# Patient Record
Sex: Female | Born: 1950
Health system: Southern US, Community
[De-identification: ages and names within clinical notes are randomized; demographics above are authoritative.]

## PROBLEM LIST (undated history)

## (undated) HISTORY — PX: APPENDECTOMY: SHX54

## (undated) HISTORY — PX: TUBAL LIGATION: SHX77

## (undated) HISTORY — PX: BUNIONECTOMY: SHX129

## (undated) HISTORY — PX: COSMETIC SURGERY: SHX468

---

## 2004-12-05 LAB — HM COLONOSCOPY: HM Colonoscopy: NORMAL

## 2004-12-08 ENCOUNTER — Ambulatory Visit: Payer: Self-pay | Admitting: Gastroenterology

## 2005-11-21 ENCOUNTER — Ambulatory Visit: Payer: Self-pay | Admitting: Unknown Physician Specialty

## 2006-09-25 ENCOUNTER — Ambulatory Visit: Payer: Self-pay | Admitting: Podiatry

## 2006-12-26 ENCOUNTER — Ambulatory Visit: Payer: Self-pay | Admitting: Unknown Physician Specialty

## 2007-12-30 ENCOUNTER — Ambulatory Visit: Payer: Self-pay | Admitting: Unknown Physician Specialty

## 2008-12-05 LAB — HM DEXA SCAN

## 2008-12-31 ENCOUNTER — Ambulatory Visit: Payer: Self-pay | Admitting: Unknown Physician Specialty

## 2009-07-14 ENCOUNTER — Emergency Department: Payer: Self-pay | Admitting: Emergency Medicine

## 2009-08-23 ENCOUNTER — Emergency Department: Payer: Self-pay | Admitting: Emergency Medicine

## 2009-09-09 HISTORY — PX: BREAST BIOPSY: SHX20

## 2010-01-03 ENCOUNTER — Ambulatory Visit: Payer: Self-pay | Admitting: Unknown Physician Specialty

## 2010-12-06 LAB — HM PAP SMEAR: HM Pap smear: NORMAL

## 2010-12-06 LAB — HM MAMMOGRAPHY: HM Mammogram: NORMAL

## 2011-01-23 ENCOUNTER — Ambulatory Visit: Payer: Self-pay | Admitting: Unknown Physician Specialty

## 2011-02-06 LAB — HM MAMMOGRAPHY

## 2011-05-08 ENCOUNTER — Ambulatory Visit: Payer: Self-pay | Admitting: Unknown Physician Specialty

## 2011-12-06 ENCOUNTER — Encounter: Payer: Self-pay | Admitting: Internal Medicine

## 2011-12-06 ENCOUNTER — Ambulatory Visit (INDEPENDENT_AMBULATORY_CARE_PROVIDER_SITE_OTHER): Payer: PRIVATE HEALTH INSURANCE | Admitting: Internal Medicine

## 2011-12-06 VITALS — BP 130/72 | HR 80 | Temp 98.5°F | Resp 14 | Ht 65.0 in | Wt 148.5 lb

## 2011-12-06 DIAGNOSIS — F3289 Other specified depressive episodes: Secondary | ICD-10-CM

## 2011-12-06 DIAGNOSIS — K219 Gastro-esophageal reflux disease without esophagitis: Secondary | ICD-10-CM

## 2011-12-06 DIAGNOSIS — N390 Urinary tract infection, site not specified: Secondary | ICD-10-CM

## 2011-12-06 DIAGNOSIS — F329 Major depressive disorder, single episode, unspecified: Secondary | ICD-10-CM

## 2011-12-06 DIAGNOSIS — Z1239 Encounter for other screening for malignant neoplasm of breast: Secondary | ICD-10-CM

## 2011-12-06 DIAGNOSIS — F32A Depression, unspecified: Secondary | ICD-10-CM

## 2011-12-06 MED ORDER — ZOLPIDEM TARTRATE 10 MG PO TABS: 10.0000 mg | ORAL_TABLET | Freq: Every evening | ORAL | Status: DC | PRN

## 2011-12-06 NOTE — Progress Notes (Signed)
Patient ID: Hetal Proano, female   DOB: May 21, 1951, 61 y.o.   MRN: [id number]  Patient Active Problem List  Diagnoses  . GERD (gastroesophageal reflux disease)  . Recurrent urinary tract infection  . Depressive disorder    Subjective:  CC:   Chief Complaint  Patient presents with  . New Patient    HPI:   Laniyah Rosenwald a 61 y.o. female who presents As a new patient for primary care. She is transferring from Dr. Francia Greaves. She has  a history of gastroesophageal reflux disease depression and a heart murmur as well as recurrent urinary tract infections .  her depression symptoms are mild but do include insomnia as well as daily anxiety related to her spouse Annette Stable is in caring for her aging parents are live 2 hours from her.  she has recently placed her father in memory care center and it does not seem to be adjusting well to being away from his wife . She receives no help from her brother.  She is concerned about the stress this will take of her marriage since he frequently has to go down on the weekends to deal with caregiver issues financial issues etc..  Her second concern is a history of osteopenia and need for followup. She has had a bone density test in the past but cannot remember for many years ago it has been. She is up-to-date on her Pap smears the last one in April 2012 and her last mammogram in May 2012   History reviewed. No pertinent past medical history.  Past Surgical History  Procedure Date  . Bunionectomy          The following portions of the patient's history were reviewed and updated as appropriate: Allergies, current medications, and problem list.    Review of Systems:   12 Pt  review of systems was negative except those addressed in the HPI,     History   Social History  . Marital Status: Married    Spouse Name: N/A    Number of Children: N/A  . Years of Education: N/A   Occupational History  . Not on file.   Social History Main Topics    . Smoking status: Never Smoker   . Smokeless tobacco: Never Used  . Alcohol Use: Yes  . Drug Use: No  . Sexually Active: Not on file   Other Topics Concern  . Not on file   Social History Narrative  . No narrative on file    Objective:  BP 130/72  Pulse 80  Temp(Src) 98.5 F (36.9 C) (Oral)  Resp 14  Ht 5\' 5"  (1.651 m)  Wt 148 lb 8 oz (67.359 kg)  BMI 24.71 kg/m2  SpO2 96%  General appearance: alert, cooperative and appears stated age Ears: normal TM's and external ear canals both ears Throat: lips, mucosa, and tongue normal; teeth and gums normal Neck: no adenopathy, no carotid bruit, supple, symmetrical, trachea midline and thyroid not enlarged, symmetric, no tenderness/mass/nodules Back: symmetric, no curvature. ROM normal. No CVA tenderness. Lungs: clear to auscultation bilaterally Heart: regular rate and rhythm, S1, S2 normal, no murmur, click, rub or gallop Abdomen: soft, non-tender; bowel sounds normal; no masses,  no organomegaly Pulses: 2+ and symmetric Skin: Skin color, texture, turgor normal. No rashes or lesions Lymph nodes: Cervical, supraclavicular, and axillary nodes normal.  Assessment and Plan:  Depressive disorder  her symptoms of her symptoms appear to be more anxiety driven and are aggravated by her spouse Annette Stable  is a primary caregiver to her aging parents who live to 2 hours aw.ay she does not want to start any SSRI therapy but wants to continue using Ambien for management at nighttime insomnia. I have recommended daily exercise as a natural way of dealing with anxiety and stress. She is looking for an exercise partner to work at her regular scheduled. Spent 20 minutes discussing the potential for burnout as a caregiver for patient for parents with dementia. I have recommended that she begin plans to move her parents closer to her before they become financially limited. We discussed several assisted living facilities in [place] that have memory care  facilities  Recurrent urinary tract infection She has not had any in recent months.  GERD (gastroesophageal reflux disease) Management daily use of Nexium. Prior trial of omeprazole did not manage symptoms adequately.    Updated Medication List Outpatient Encounter Prescriptions as of 12/06/2011  Medication Sig Dispense Refill  . calcium citrate-vitamin D (CITRACAL+D) 315-200 MG-UNIT per tablet Take 1 tablet by mouth 2 (two) times daily.      Marland Kitchen esomeprazole (NEXIUM) 40 MG capsule Take 40 mg by mouth daily before breakfast.      . Multiple Vitamin (MULTIVITAMIN) tablet Take 1 tablet by mouth daily.      Marland Kitchen zolpidem (AMBIEN) 10 MG tablet Take 1 tablet (10 mg total) by mouth at bedtime as needed.  30 tablet  5  . DISCONTD: zolpidem (AMBIEN) 10 MG tablet Take 10 mg by mouth at bedtime as needed.         Orders Placed This Encounter  Procedures  . HM MAMMOGRAPHY  . HM DEXA SCAN  . MM Digital Screening  . HM PAP SMEAR  . HM COLONOSCOPY    Return in about 1 month (around 01/05/2012).

## 2011-12-09 DIAGNOSIS — R300 Dysuria: Secondary | ICD-10-CM | POA: Insufficient documentation

## 2011-12-09 DIAGNOSIS — F329 Major depressive disorder, single episode, unspecified: Secondary | ICD-10-CM | POA: Insufficient documentation

## 2011-12-09 DIAGNOSIS — K219 Gastro-esophageal reflux disease without esophagitis: Secondary | ICD-10-CM | POA: Insufficient documentation

## 2011-12-09 NOTE — Assessment & Plan Note (Signed)
She has not had any in recent months.

## 2011-12-09 NOTE — Assessment & Plan Note (Signed)
her symptoms of her symptoms appear to be more anxiety driven and are aggravated by her spouse Annette Stable is a primary caregiver to her aging parents who live to 2 hours away she does not want to start any SSRI therapy but wants to continue using Ambien for management at nighttime insomnia. I have recommended daily exercise as a natural way of dealing with anxiety and stress. She is looking for an exercise partner to work at her regular scheduled.

## 2011-12-09 NOTE — Assessment & Plan Note (Signed)
Management daily use of Nexium. Prior trial of omeprazole did not manage symptoms adequately.

## 2012-02-13 ENCOUNTER — Encounter: Payer: PRIVATE HEALTH INSURANCE | Admitting: Internal Medicine

## 2012-03-26 ENCOUNTER — Encounter: Payer: Self-pay | Admitting: Internal Medicine

## 2012-04-07 ENCOUNTER — Encounter: Payer: Self-pay | Admitting: Internal Medicine

## 2012-04-07 ENCOUNTER — Ambulatory Visit (INDEPENDENT_AMBULATORY_CARE_PROVIDER_SITE_OTHER): Payer: PRIVATE HEALTH INSURANCE | Admitting: Internal Medicine

## 2012-04-07 VITALS — BP 116/70 | HR 76 | Temp 98.4°F | Resp 16 | Ht 65.0 in | Wt 146.2 lb

## 2012-04-07 DIAGNOSIS — Z0000 Encounter for general adult medical examination without abnormal findings: Secondary | ICD-10-CM

## 2012-04-07 DIAGNOSIS — Z1239 Encounter for other screening for malignant neoplasm of breast: Secondary | ICD-10-CM

## 2012-04-07 DIAGNOSIS — Z0001 Encounter for general adult medical examination with abnormal findings: Secondary | ICD-10-CM | POA: Insufficient documentation

## 2012-04-07 MED ORDER — ESOMEPRAZOLE MAGNESIUM 40 MG PO CPDR: 40.0000 mg | DELAYED_RELEASE_CAPSULE | Freq: Every day | ORAL | Status: DC

## 2012-04-07 NOTE — Assessment & Plan Note (Signed)
recommeding mammogram at [facility] due to microcalifications noted on 2012 exam.

## 2012-04-07 NOTE — Assessment & Plan Note (Signed)
Breast and GYN to be done next week by her gynecologist.

## 2012-04-07 NOTE — Progress Notes (Signed)
Patient ID: Kimberly Gill, female   DOB: 06-13-1951, 61 y.o.   MRN: [id number]   Subjective:    Kimberly Gill is a 61 y.o. female who presents for an annual exam. The patient has no complaints today. The patient is sexually active. GYN screening history: last pap: approximate date April 2012 and was normal. The patient wears seatbelts: yes. The patient participates in regular exercise: no. Has the patient ever been transfused or tattooed?: no. The patient reports that there is not domestic violence in her life.   Menstrual History: OB History    Grav Para Term Preterm Abortions TAB SAB Ect Mult Living                  Menarche age: 78 No LMP recorded. Patient is postmenopausal.    The following portions of the patient's history were reviewed and updated as appropriate: allergies, current medications, past family history, past medical history, past social history, past surgical history and problem list.  Review of Systems A comprehensive review of systems was negative.    Objective:    BP 116/70  Pulse 76  Temp 98.4 F (36.9 C) (Oral)  Resp 16  Ht 5\' 5"  (1.651 m)  Wt 146 lb 4 oz (66.339 kg)  BMI 24.34 kg/m2  SpO2 96%  General Appearance:    Alert, cooperative, no distress, appears stated age  Head:    Normocephalic, without obvious abnormality, atraumatic  Eyes:    PERRL, conjunctiva/corneas clear, EOM's intact, fundi    benign, both eyes  Ears:    Normal TM's and external ear canals, both ears  Nose:   Nares normal, septum midline, mucosa normal, no drainage    or sinus tenderness  Throat:   Lips, mucosa, and tongue normal; teeth and gums normal  Neck:   Supple, symmetrical, trachea midline, no adenopathy;    thyroid:  no enlargement/tenderness/nodules; no carotid   bruit or JVD  Back:     Symmetric, no curvature, ROM normal, no CVA tenderness  Lungs:     Clear to auscultation bilaterally, respirations unlabored  Chest Wall:    No tenderness or deformity   Heart:    Regular  rate and rhythm, S1 and S2 normal, no murmur, rub   or gallop  Abdomen:     Soft, non-tender, bowel sounds active all four quadrants,    no masses, no organomegaly  Extremities:   Extremities normal, atraumatic, no cyanosis or edema  Pulses:   2+ and symmetric all extremities  Skin:   Skin color, texture, turgor normal, no rashes or lesions  Lymph nodes:   Cervical, supraclavicular, and axillary nodes normal  Neurologic:   CNII-XII intact, normal strength, sensation and reflexes    throughout  .    Assessment:    Healthy female exam.    Plan:    Routine general medical examination at a health care facility Breast and GYN to be done next week by her gynecologist.  Screening for breast cancer recommeding mammogram at [facility] due to microcalifications noted on 2012 exam.    Updated Medication List Outpatient Encounter Prescriptions as of 04/07/2012  Medication Sig Dispense Refill  . calcium citrate-vitamin D (CITRACAL+D) 315-200 MG-UNIT per tablet Take 1 tablet by mouth 2 (two) times daily.      Marland Kitchen esomeprazole (NEXIUM) 40 MG capsule Take 1 capsule (40 mg total) by mouth daily before breakfast.  30 capsule  5  . Multiple Vitamin (MULTIVITAMIN) tablet Take 1 tablet by mouth  daily.      . zolpidem (AMBIEN) 10 MG tablet Take 1 tablet (10 mg total) by mouth at bedtime as needed.  30 tablet  5  . DISCONTD: esomeprazole (NEXIUM) 40 MG capsule Take 40 mg by mouth daily before breakfast.

## 2012-04-25 ENCOUNTER — Other Ambulatory Visit (INDEPENDENT_AMBULATORY_CARE_PROVIDER_SITE_OTHER): Payer: PRIVATE HEALTH INSURANCE | Admitting: *Deleted

## 2012-04-25 DIAGNOSIS — N390 Urinary tract infection, site not specified: Secondary | ICD-10-CM

## 2012-04-29 LAB — URINE CULTURE: Colony Count: 25000

## 2012-05-01 ENCOUNTER — Other Ambulatory Visit (INDEPENDENT_AMBULATORY_CARE_PROVIDER_SITE_OTHER): Payer: PRIVATE HEALTH INSURANCE | Admitting: *Deleted

## 2012-05-01 DIAGNOSIS — N390 Urinary tract infection, site not specified: Secondary | ICD-10-CM

## 2012-05-01 LAB — POCT URINALYSIS DIPSTICK
Blood, UA: NEGATIVE
Ketones, UA: NEGATIVE
Protein, UA: NEGATIVE
Spec Grav, UA: 1.02
Urobilinogen, UA: 0.2
pH, UA: 7.5

## 2012-07-14 ENCOUNTER — Other Ambulatory Visit: Payer: Self-pay | Admitting: Internal Medicine

## 2012-07-14 NOTE — Telephone Encounter (Signed)
Pt is needing a refill on Ambien and [facility] uses [facility] on [address]

## 2012-07-15 ENCOUNTER — Other Ambulatory Visit: Payer: Self-pay

## 2012-07-15 MED ORDER — ZOLPIDEM TARTRATE 10 MG PO TABS: 10.0000 mg | ORAL_TABLET | Freq: Every evening | ORAL | Status: DC | PRN

## 2012-07-15 NOTE — Telephone Encounter (Signed)
[facility]BIEN [facility]ED [facility]

## 2012-07-15 NOTE — Telephone Encounter (Signed)
[facility] for Ambien 10 mg. Ok to refill?

## 2013-04-14 ENCOUNTER — Encounter: Payer: Self-pay | Admitting: Internal Medicine

## 2013-04-14 ENCOUNTER — Ambulatory Visit (INDEPENDENT_AMBULATORY_CARE_PROVIDER_SITE_OTHER): Payer: PRIVATE HEALTH INSURANCE | Admitting: Internal Medicine

## 2013-04-14 VITALS — BP 118/72 | HR 79 | Temp 97.4°F | Resp 14 | Ht 65.5 in | Wt 148.2 lb

## 2013-04-14 DIAGNOSIS — R141 Gas pain: Secondary | ICD-10-CM

## 2013-04-14 DIAGNOSIS — R140 Abdominal distension (gaseous): Secondary | ICD-10-CM | POA: Insufficient documentation

## 2013-04-14 DIAGNOSIS — Z1211 Encounter for screening for malignant neoplasm of colon: Secondary | ICD-10-CM

## 2013-04-14 DIAGNOSIS — E559 Vitamin D deficiency, unspecified: Secondary | ICD-10-CM

## 2013-04-14 DIAGNOSIS — R5381 Other malaise: Secondary | ICD-10-CM

## 2013-04-14 DIAGNOSIS — Z0000 Encounter for general adult medical examination without abnormal findings: Secondary | ICD-10-CM

## 2013-04-14 DIAGNOSIS — R300 Dysuria: Secondary | ICD-10-CM

## 2013-04-14 DIAGNOSIS — F329 Major depressive disorder, single episode, unspecified: Secondary | ICD-10-CM

## 2013-04-14 DIAGNOSIS — N952 Postmenopausal atrophic vaginitis: Secondary | ICD-10-CM | POA: Insufficient documentation

## 2013-04-14 DIAGNOSIS — Z1239 Encounter for other screening for malignant neoplasm of breast: Secondary | ICD-10-CM

## 2013-04-14 LAB — CBC WITH DIFFERENTIAL/PLATELET
Basophils Relative: 0.7 % (ref 0.0–3.0)
Eosinophils Absolute: 0.1 10*3/uL (ref 0.0–0.7)
Eosinophils Relative: 0.9 % (ref 0.0–5.0)
Hemoglobin: 14.5 g/dL (ref 12.0–15.0)
Lymphocytes Relative: 32.6 % (ref 12.0–46.0)
MCHC: 33.3 g/dL (ref 30.0–36.0)
MCV: 92.2 fl (ref 78.0–100.0)
Neutro Abs: 3.4 10*3/uL (ref 1.4–7.7)
RBC: 4.72 Mil/uL (ref 3.87–5.11)
WBC: 6.1 10*3/uL (ref 4.5–10.5)

## 2013-04-14 LAB — COMPREHENSIVE METABOLIC PANEL
AST: 22 U/L (ref 0–37)
Albumin: 4.3 g/dL (ref 3.5–5.2)
BUN: 14 mg/dL (ref 6–23)
CO2: 29 mEq/L (ref 19–32)
Calcium: 9.9 mg/dL (ref 8.4–10.5)
Chloride: 102 mEq/L (ref 96–112)
Creatinine, Ser: 0.8 mg/dL (ref 0.4–1.2)
GFR: 78.08 mL/min (ref >60.00)
Potassium: 4.9 mEq/L (ref 3.5–5.1)

## 2013-04-14 LAB — LIPID PANEL
Cholesterol: 251 mg/dL — ABNORMAL HIGH (ref 0–200)
Total CHOL/HDL Ratio: 3
Triglycerides: 48 mg/dL (ref 0.0–149.0)
VLDL: 9.6 mg/dL (ref 0.0–40.0)

## 2013-04-14 LAB — LDL CHOLESTEROL, DIRECT: Direct LDL: 140.8 mg/dL

## 2013-04-14 MED ORDER — ALPRAZOLAM 0.25 MG PO TABS
0.2500 mg | ORAL_TABLET | Freq: Every evening | ORAL | Status: DC | PRN
Start: 2013-04-14 — End: 2015-04-20

## 2013-04-14 MED ORDER — ESTRADIOL 10 MCG VA TABS: ORAL_TABLET | VAGINAL | Status: DC

## 2013-04-14 MED ORDER — ALPRAZOLAM 0.25 MG PO TABS: 0.2500 mg | ORAL_TABLET | Freq: Every evening | ORAL | Status: DC | PRN

## 2013-04-14 NOTE — Assessment & Plan Note (Signed)
With anxieyt.  Father died last week,  Doing yoga.

## 2013-04-14 NOTE — Assessment & Plan Note (Addendum)
Trial of vaginal estrogen for one month for persististent recurrent dysuria .  Vagifem prescription sent to pharmacy.

## 2013-04-14 NOTE — Progress Notes (Addendum)
Patient ID: Kimberly Gill, female   DOB: 01-10-1951, 62 y.o.   MRN: [id number]   Subjective:     Kimberly Gill is a 62 y.o. female and is here for a comprehensive physical exam. The patient reports multiple problems to discuss today.  1) Recurrent bladder issues.  She has frequency with intermittent burning without gross hematuria and has had several  Evaluations here for a UTI which were negative. [facility] She was apparently referred for urology for evaluation of suspected chronic cystitis but she has deferred a referral. She was also given a prescription for Elmiron ut after reading about it its risks and benefits she decided not to try it. She has never been tried on vaginal hormone therapy .  2) Cervical ca screening "  Her last Pap smear was done several months ago at which time the PA denies having given her samples of oral hormone therapy and stated that she never would've done this because her risk of blood clots and strokes would be increased. Patient would like to continue regular gynecologic care with me.  3) Grief:  Her father died last Jan 15, 2023 in [facility].  Although he was 63 years old and was suffering from Alzheimer's dementia, aortic stenosis and CHF she was not prepared for his death and has been having increased insomnia and anxiety this week.    4) Abdominal bloating.  Patient states that she has had vague abdominal discomfort and bloating with increased pelvic pressure on and off for the last 5-6 months. It is not postprandial. She has had no unintentional weight loss. She is postmenopausal and still retains her ovaries and uterus. She has tried increasing her yoga classes which has not helped. Her bowel movements are regular and  she denies constipation.    History   Social History  . Marital Status: Married    Spouse Name: N/A    Number of Children: N/A  . Years of Education: N/A   Occupational History  . Not on file.   Social History  Main Topics  . Smoking status: Never Smoker   . Smokeless tobacco: Never Used  . Alcohol Use: Yes  . Drug Use: No  . Sexual Activity: Not on file   Other Topics Concern  . Not on file   Social History Narrative  . No narrative on file   Health Maintenance  Topic Date Due  . Tetanus/tdap  02/06/1969  . Zostavax  02/06/2010  . Mammogram  02/05/2013  . Influenza Vaccine  03/20/2013  . Colonoscopy  12/06/2014  . Pap Smear  02/06/2015    The following portions of the patient's history were reviewed and updated as appropriate: allergies, current medications, past family history, past medical history, past social history, past surgical history and problem list.  Review of Systems A comprehensive review of systems was negative.   Objective:   BP 118/72  Pulse 79  Temp(Src) 97.4 F (36.3 C) (Oral)  Resp 14  Ht 5' 5.5" (1.664 m)  Wt 148 lb 4 oz (67.246 kg)  BMI 24.29 kg/m2  SpO2 98%  General appearance: alert, cooperative and appears stated age Ears: normal TM's and external ear canals both ears Throat: lips, mucosa, and tongue normal; teeth and gums normal Neck: no adenopathy, no carotid bruit, supple, symmetrical, trachea midline and thyroid not enlarged, symmetric, no tenderness/mass/nodules Back: symmetric, no curvature. ROM normal. No CVA tenderness. Lungs: clear to auscultation bilaterally Heart: regular rate and rhythm, S1, S2 normal, no  murmur, click, rub or gallop Abdomen: soft, non-tender; bowel sounds normal; no masses,  no organomegaly Pulses: 2+ and symmetric Skin: Skin color, texture, turgor normal. No rashes or lesions Lymph nodes: Cervical, supraclavicular, and axillary nodes normal.    Assessment:   Postmenopausal atrophic vaginitis Trial of vaginal estrogen for one month for persististent recurrent dysuria .  Vagifem prescription sent to pharmacy.  Depressive disorder With anxieyt.  Father died last week,  Doing yoga.   Abdominal bloating She is  not overweight and her bloating is not associated with constipation or postprandial pain. Given her postmenopausal status we will to rule out ovarian cancer. CA 125 and abdominal and pelvic CT with contrast has been ordered.  Dysuria Her current symptoms in the absence of infection suggests either chronic cystitis are atrophic vaginitis. since she is reluctant to see a urologist I have offered her a trial of vaginal estrogen for one month. If her symptoms do not improve she has agreed to see a urologist. We will plan on seeing if we can get her in to see Lafe Garin at [facility].Marland Kitchen  Routine general medical examination at a health care facility Annual comprehensive exam was done excluding breast, pelvic and PAP smear. As this was done by her gynecologist. All screenings have been addressed .    A total of 40 minutes was spent with patient more than half of which was spent in counseling, reviewing records from other prviders and coordination of care.  Updated Medication List Outpatient Encounter Prescriptions as of 04/14/2013  Medication Sig Dispense Refill  . calcium citrate-vitamin D (CITRACAL+D) 315-200 MG-UNIT per tablet Take 1 tablet by mouth 2 (two) times daily.      . Multiple Vitamin (MULTIVITAMIN) tablet Take 1 tablet by mouth daily.      Marland Kitchen zolpidem (AMBIEN) 10 MG tablet Take 1 tablet (10 mg total) by mouth at bedtime as needed.  30 tablet  5  . ALPRAZolam (XANAX) 0.25 MG tablet Take 1 tablet (0.25 mg total) by mouth at bedtime as needed for sleep.  30 tablet  2  . esomeprazole (NEXIUM) 40 MG capsule Take 1 capsule (40 mg total) by mouth daily before breakfast.  30 capsule  5  . Estradiol (VAGIFEM) 10 MCG TABS vaginal tablet 1 tablet inserted vaginally every night for 2 weeks,  Then twice weekly therafter  22 tablet  0  . [DISCONTINUED] ALPRAZolam (XANAX) 0.25 MG tablet Take 1 tablet (0.25 mg total) by mouth at bedtime as needed for sleep.  30 tablet  2   No facility-administered encounter  medications on file as of 04/14/2013.

## 2013-04-14 NOTE — Assessment & Plan Note (Signed)
Annual comprehensive exam was done excluding breast, pelvic and PAP smear. As this was done by her gynecologist. All screenings have been addressed .

## 2013-04-14 NOTE — Patient Instructions (Addendum)
Trial of vagifem (vaginal estrogen) for one month  If symptoms do not resolve.,  Referral to Dr Doy Hutching at [facility]  CT of abdomen and pevlis to evaluate postmenopausal bloating  Trial of alprazolam for insomnia

## 2013-04-14 NOTE — Assessment & Plan Note (Signed)
She is not overweight and her bloating is not associated with constipation or postprandial pain. Given her postmenopausal status we will to rule out ovarian cancer. CA 125 and abdominal and pelvic CT with contrast has been ordered.

## 2013-04-14 NOTE — Assessment & Plan Note (Signed)
Her current symptoms in the absence of infection suggests either chronic cystitis are atrophic vaginitis. since she is reluctant to see a urologist I have offered her a trial of vaginal estrogen for one month. If her symptoms do not improve she has agreed to see a urologist. We will plan on seeing if we can get her in to see Lafe Garin at [facility].Marland Kitchen

## 2013-04-15 LAB — VITAMIN D 25 HYDROXY (VIT D DEFICIENCY, FRACTURES): Vit D, 25-Hydroxy: 49 ng/mL (ref 30–89)

## 2013-04-16 ENCOUNTER — Encounter: Payer: Self-pay | Admitting: *Deleted

## 2013-04-24 ENCOUNTER — Ambulatory Visit: Payer: Self-pay | Admitting: Internal Medicine

## 2013-04-26 ENCOUNTER — Telehealth: Payer: Self-pay | Admitting: Internal Medicine

## 2013-04-26 DIAGNOSIS — R140 Abdominal distension (gaseous): Secondary | ICD-10-CM

## 2013-04-26 NOTE — Telephone Encounter (Signed)
Abdominal CT was  Normal except for several hepatic and one tiny renal cyst which  Benign and do not require further workup.  Her bloating may be due to diet will recommended trying a  lower glycemi c index diet or gluten reduced  Diet.

## 2013-04-27 NOTE — Telephone Encounter (Signed)
Left message to return call 

## 2013-04-28 ENCOUNTER — Encounter: Payer: Self-pay | Admitting: Internal Medicine

## 2013-04-29 NOTE — Telephone Encounter (Signed)
Patient notifed

## 2013-05-04 ENCOUNTER — Ambulatory Visit: Payer: Self-pay | Admitting: Internal Medicine

## 2013-05-06 ENCOUNTER — Encounter: Payer: Self-pay | Admitting: Internal Medicine

## 2013-05-22 ENCOUNTER — Encounter: Payer: Self-pay | Admitting: Internal Medicine

## 2013-06-25 ENCOUNTER — Other Ambulatory Visit: Payer: Self-pay

## 2013-07-31 ENCOUNTER — Telehealth: Payer: Self-pay | Admitting: Internal Medicine

## 2013-07-31 MED ORDER — FLUCONAZOLE 150 MG PO TABS: 150.0000 mg | ORAL_TABLET | Freq: Every day | ORAL | Status: DC

## 2013-07-31 NOTE — Telephone Encounter (Signed)
Vaginal discharge, white discharge, burning and discomfort x1 wk.  Thinks it is vaginal yeast. Wants something called in.

## 2013-07-31 NOTE — Telephone Encounter (Signed)
No appt needed unless rx for fluconazoel does not resolve the discharge .  rx went to [facility]

## 2013-07-31 NOTE — Telephone Encounter (Signed)
Left message, notifying pt Rx sent and to call back with failure of improvement.

## 2013-07-31 NOTE — Telephone Encounter (Signed)
Can something be called in or does she need a visit?

## 2013-09-16 ENCOUNTER — Encounter: Payer: Self-pay | Admitting: Internal Medicine

## 2013-09-17 ENCOUNTER — Other Ambulatory Visit: Payer: Self-pay | Admitting: *Deleted

## 2013-09-17 ENCOUNTER — Encounter: Payer: Self-pay | Admitting: Internal Medicine

## 2013-09-18 ENCOUNTER — Other Ambulatory Visit: Payer: Self-pay | Admitting: *Deleted

## 2013-09-18 MED ORDER — ZOLPIDEM TARTRATE 10 MG PO TABS: 10.0000 mg | ORAL_TABLET | Freq: Every evening | ORAL | Status: DC | PRN

## 2013-09-18 NOTE — Telephone Encounter (Signed)
error 

## 2013-09-18 NOTE — Telephone Encounter (Signed)
Ok to refill,  Authorized in epic 

## 2013-09-22 ENCOUNTER — Encounter: Payer: Self-pay | Admitting: Internal Medicine

## 2013-09-22 NOTE — Telephone Encounter (Signed)
Prescription called in to MD line at pharmacy

## 2013-11-11 ENCOUNTER — Encounter: Payer: Self-pay | Admitting: Internal Medicine

## 2014-02-14 LAB — HM PAP SMEAR: HM Pap smear: NORMAL

## 2014-04-16 ENCOUNTER — Encounter: Payer: Self-pay | Admitting: Internal Medicine

## 2014-04-16 ENCOUNTER — Ambulatory Visit (INDEPENDENT_AMBULATORY_CARE_PROVIDER_SITE_OTHER): Payer: PRIVATE HEALTH INSURANCE | Admitting: Internal Medicine

## 2014-04-16 VITALS — BP 108/72 | HR 64 | Temp 97.8°F | Resp 16 | Ht 65.5 in | Wt 153.2 lb

## 2014-04-16 DIAGNOSIS — Z0000 Encounter for general adult medical examination without abnormal findings: Secondary | ICD-10-CM

## 2014-04-16 DIAGNOSIS — E559 Vitamin D deficiency, unspecified: Secondary | ICD-10-CM

## 2014-04-16 DIAGNOSIS — Z23 Encounter for immunization: Secondary | ICD-10-CM

## 2014-04-16 DIAGNOSIS — R140 Abdominal distension (gaseous): Secondary | ICD-10-CM

## 2014-04-16 DIAGNOSIS — M545 Low back pain, unspecified: Secondary | ICD-10-CM

## 2014-04-16 DIAGNOSIS — R5381 Other malaise: Secondary | ICD-10-CM

## 2014-04-16 DIAGNOSIS — R5383 Other fatigue: Secondary | ICD-10-CM

## 2014-04-16 DIAGNOSIS — N952 Postmenopausal atrophic vaginitis: Secondary | ICD-10-CM

## 2014-04-16 DIAGNOSIS — E785 Hyperlipidemia, unspecified: Secondary | ICD-10-CM

## 2014-04-16 DIAGNOSIS — Z1239 Encounter for other screening for malignant neoplasm of breast: Secondary | ICD-10-CM

## 2014-04-16 LAB — COMPREHENSIVE METABOLIC PANEL
ALT: 14 U/L (ref 0–35)
AST: 22 U/L (ref 0–37)
Albumin: 4 g/dL (ref 3.5–5.2)
Alkaline Phosphatase: 50 U/L (ref 39–117)
BUN: 10 mg/dL (ref 6–23)
CO2: 29 mEq/L (ref 19–32)
Calcium: 9.3 mg/dL (ref 8.4–10.5)
Chloride: 102 mEq/L (ref 96–112)
Creatinine, Ser: 0.8 mg/dL (ref 0.4–1.2)
GFR: 76.71 mL/min (ref >60.00)
Glucose, Bld: 83 mg/dL (ref 70–99)
Potassium: 4.3 mEq/L (ref 3.5–5.1)
Sodium: 137 mEq/L (ref 135–145)
Total Bilirubin: 0.7 mg/dL (ref 0.2–1.2)
Total Protein: 7.4 g/dL (ref 6.0–8.3)

## 2014-04-16 LAB — CBC WITH DIFFERENTIAL/PLATELET
Basophils Absolute: 0.1 10*3/uL (ref 0.0–0.1)
Basophils Relative: 1.2 % (ref 0.0–3.0)
EOS PCT: 2.5 % (ref 0.0–5.0)
Eosinophils Absolute: 0.1 10*3/uL (ref 0.0–0.7)
HCT: 40 % (ref 36.0–46.0)
Hemoglobin: 13.3 g/dL (ref 12.0–15.0)
LYMPHS PCT: 42.5 % (ref 12.0–46.0)
Lymphs Abs: 2.3 10*3/uL (ref 0.7–4.0)
MCHC: 33.3 g/dL (ref 30.0–36.0)
MCV: 92.7 fl (ref 78.0–100.0)
Monocytes Absolute: 0.5 10*3/uL (ref 0.1–1.0)
Monocytes Relative: 9.9 % (ref 3.0–12.0)
NEUTROS PCT: 43.9 % (ref 43.0–77.0)
Neutro Abs: 2.3 10*3/uL (ref 1.4–7.7)
Platelets: 268 10*3/uL (ref 150.0–400.0)
RBC: 4.32 Mil/uL (ref 3.87–5.11)
RDW: 14.5 % (ref 11.5–15.5)
WBC: 5.3 10*3/uL (ref 4.0–10.5)

## 2014-04-16 LAB — LIPID PANEL
CHOLESTEROL: 222 mg/dL — AB (ref 0–200)
HDL: 89.9 mg/dL (ref >39.00)
LDL CALC: 119 mg/dL — AB (ref 0–99)
NonHDL: 132.1
TRIGLYCERIDES: 66 mg/dL (ref 0.0–149.0)
Total CHOL/HDL Ratio: 2
VLDL: 13.2 mg/dL (ref 0.0–40.0)

## 2014-04-16 LAB — TSH: TSH: 0.82 u[IU]/mL (ref 0.35–4.50)

## 2014-04-16 MED ORDER — MELOXICAM 15 MG PO TABS: 15.0000 mg | ORAL_TABLET | Freq: Every day | ORAL | Status: DC

## 2014-04-16 MED ORDER — CELECOXIB 400 MG PO CAPS: 400.0000 mg | ORAL_CAPSULE | Freq: Every day | ORAL | Status: DC

## 2014-04-16 MED ORDER — METHOCARBAMOL 500 MG PO TABS: 500.0000 mg | ORAL_TABLET | Freq: Three times a day (TID) | ORAL | Status: DC | PRN

## 2014-04-16 MED ORDER — HYDROCODONE-ACETAMINOPHEN 5-325 MG PO TABS: 1.0000 | ORAL_TABLET | Freq: Every evening | ORAL | Status: DC | PRN

## 2014-04-16 NOTE — Progress Notes (Signed)
Pre-visit discussion using our clinic review tool. No additional management support is needed unless otherwise documented below in the visit note.  

## 2014-04-16 NOTE — Progress Notes (Signed)
Patient ID: Kimberly Gill, female   DOB: Mar 13, 1951, 63 y.o.   MRN: [id number]    MSSubjective:     Kimberly Gill is a 63 y.o. female and is here for a comprehensive physical exam. The patient reports right sided back pain for several weeks.  Aggravated by workouts and started after lifting a microwave oven out of her trunk.  Has pain radiatign to right buttock and thigh but not beyond. No foot drop or stumbling,  No numbness or tingling,  Keeping her awake at night .   No trial of NSAIDs.   History   Social History  . Marital Status: Married    Spouse Name: N/A    Number of Children: N/A  . Years of Education: N/A   Occupational History  . Not on file.   Social History Main Topics  . Smoking status: Never Smoker   . Smokeless tobacco: Never Used  . Alcohol Use: Yes  . Drug Use: No  . Sexual Activity: Not on file   Other Topics Concern  . Not on file   Social History Narrative  . No narrative on file   Health Maintenance  Topic Date Due  . Colonoscopy  12/06/2014  . Influenza Vaccine  03/21/2015  . Mammogram  05/05/2015  . Pap Smear  02/14/2017  . Tetanus/tdap  03/14/2022  . Zostavax  Completed    The following portions of the patient's history were reviewed and updated as appropriate: allergies, current medications, past family history, past medical history, past social history, past surgical history and problem list.  Review of Systems A comprehensive review of systems was negative.   Objective:   BP 108/72  Pulse 64  Temp(Src) 97.8 F (36.6 C) (Oral)  Resp 16  Ht 5' 5.5" (1.664 m)  Wt 153 lb 4 oz (69.514 kg)  BMI 25.11 kg/m2  SpO2 99%  General appearance: alert, cooperative and appears stated age Head: Normocephalic, without obvious abnormality, atraumatic Eyes: conjunctivae/corneas clear. PERRL, EOM's intact. Fundi benign. Ears: normal TM's and external ear canals both ears Nose: Nares normal. Septum midline. Mucosa normal. No drainage or sinus  tenderness. Throat: lips, mucosa, and tongue normal; teeth and gums normal Neck: no adenopathy, no carotid bruit, no JVD, supple, symmetrical, trachea midline and thyroid not enlarged, symmetric, no tenderness/mass/nodules Lungs: clear to auscultation bilaterally Breasts: normal appearance, no masses or tenderness Heart: regular rate and rhythm, S1, S2 normal, no murmur, click, rub or gallop Abdomen: soft, non-tender; bowel sounds normal; no masses,  no organomegaly Extremities: extremities normal, atraumatic, no cyanosis or edema Pulses: 2+ and symmetric Skin: Skin color, texture, turgor normal. No rashes or lesions Neurologic: Alert and oriented X 3, normal strength and tone. Normal symmetric reflexes. Normal coordination and gait. MSK; sacroiliac tenderness with muscle spasm,  Negative straight leg lift.   .    Assessment and Plan:   Low back pain potentially associated with radiculopathy Will treat with daily  NSAIDs, prn MR and analgesics and behavior modificaiton for two weeks,.  If no improvement, plain films have been discussed  Postmenopausal atrophic vaginitis Managed optimally with vagifem tablets, which is requiring PA per patient.  Will start PA process   Abdominal bloating Reviewed prior CT films and Ca 125 to rule out ovarian CA as cause for bloating.   Routine general medical examination at a health care facility Annual comprehensive exam was done including breast, excluding pelvic and PAP smear. All screenings have been addressed and a printed health maintenance  schedule was given to patient.    Screening for breast cancer recommending annual mammogram at [facility] wit history of microcalifications  on 2012 exam.      Updated Medication List Outpatient Encounter Prescriptions as of 04/16/2014  Medication Sig  . ALPRAZolam (XANAX) 0.25 MG tablet Take 1 tablet (0.25 mg total) by mouth at bedtime as needed for sleep.  . calcium citrate-vitamin D (CITRACAL+D)  315-200 MG-UNIT per tablet Take 1 tablet by mouth 2 (two) times daily.  . celecoxib (CELEBREX) 400 MG capsule Take 1 capsule (400 mg total) by mouth daily after breakfast.  . esomeprazole (NEXIUM) 40 MG capsule Take 1 capsule (40 mg total) by mouth daily before breakfast.  . estradiol (ESTRACE) 0.1 MG/GM vaginal cream Place 1 Applicatorful vaginally at bedtime.  . Estradiol (VAGIFEM) 10 MCG TABS vaginal tablet 1 tablet inserted vaginally every night for 2 weeks,  Then twice weekly therafter  . fluconazole (DIFLUCAN) 150 MG tablet Take 1 tablet (150 mg total) by mouth daily.  Marland Kitchen HYDROcodone-acetaminophen (NORCO/VICODIN) 5-325 MG per tablet Take 1 tablet by mouth at bedtime as needed and may repeat dose one time if needed for moderate pain.  . meloxicam (MOBIC) 15 MG tablet Take 1 tablet (15 mg total) by mouth daily.  . methocarbamol (ROBAXIN) 500 MG tablet Take 1 tablet (500 mg total) by mouth every 8 (eight) hours as needed for muscle spasms.  . Multiple Vitamin (MULTIVITAMIN) tablet Take 1 tablet by mouth daily.  Marland Kitchen zolpidem (AMBIEN) 10 MG tablet Take 1 tablet (10 mg total) by mouth at bedtime as needed.

## 2014-04-16 NOTE — Patient Instructions (Signed)
I recommend getting the majority of your calcium and Vitamin D  through diet rather than supplements given the recent association of calcium supplements with increased coronary artery calcium scores (You need 1200 mg daily )   Unsweetened almond/coconut milk is a great low calorie low carb, cholesterol free  way to increase your dietary calcium and vitamin D.  Try the blue Jackquline Bosch.  It's only 1 carb/45 cal per serving  Your back pain may be due to a herniated disk.,  I recommend treating with an anti inflammatory for 2 weeks, along with a muscle relaxer and evening narcotc (if needed) . If no improvement in 2 weeks,  Call to arrange imaging  If the celebrex is not affordable,  Have the meloxicam filled instead.  You can use tylenol 500 mg twice dauring the day as well and save the hydrcodone for evening  Health Maintenance Adopting a healthy lifestyle and getting preventive care can go a long way to promote health and wellness. Talk with your health care provider about what schedule of regular examinations is right for you. This is a good chance for you to check in with your provider about disease prevention and staying healthy. In between checkups, there are plenty of things you can do on your own. Experts have done a lot of research about which lifestyle changes and preventive measures are most likely to keep you healthy. Ask your health care provider for more information. WEIGHT AND DIET  Eat a healthy diet  Be sure to include plenty of vegetables, fruits, low-fat dairy products, and lean protein.  Do not eat a lot of foods high in solid fats, added sugars, or salt.  Get regular exercise. This is one of the most important things you can do for your health.  Most adults should exercise for at least 150 minutes each week. The exercise should increase your heart rate and make you sweat (moderate-intensity exercise).  Most adults should also do strengthening exercises at least twice a  week. This is in addition to the moderate-intensity exercise.  Maintain a healthy weight  Body mass index (BMI) is a measurement that can be used to identify possible weight problems. It estimates body fat based on height and weight. Your health care provider can help determine your BMI and help you achieve or maintain a healthy weight.  For females 63 years of age and older:   A BMI below 18.5 is considered underweight.  A BMI of 18.5 to 24.9 is normal.  A BMI of 25 to 29.9 is considered overweight.  A BMI of 30 and above is considered obese.  Watch levels of cholesterol and blood lipids  You should start having your blood tested for lipids and cholesterol at 64 years of age, then have this test every 5 years.  You may need to have your cholesterol levels checked more often if:  Your lipid or cholesterol levels are high.  You are older than 64 years of age.  You are at high risk for heart disease.  CANCER SCREENING   Lung Cancer  Lung cancer screening is recommended for adults 71-49 years old who are at high risk for lung cancer because of a history of smoking.  A yearly low-dose CT scan of the lungs is recommended for people who:  Currently smoke.  Have quit within the past 15 years.  Have at least a 30-pack-year history of smoking. A pack year is smoking an average of one pack of cigarettes a  day for 1 year.  Yearly screening should continue until it has been 15 years since you quit.  Yearly screening should stop if you develop a health problem that would prevent you from having lung cancer treatment.  Breast Cancer  Practice breast self-awareness. This means understanding how your breasts normally appear and feel.  It also means doing regular breast self-exams. Let your health care provider know about any changes, no matter how small.  If you are in your 20s or 30s, you should have a clinical breast exam (CBE) by a health care provider every 1-3 years as part  of a regular health exam.  If you are 80 or older, have a CBE every year. Also consider having a breast X-ray (mammogram) every year.  If you have a family history of breast cancer, talk to your health care provider about genetic screening.  If you are at high risk for breast cancer, talk to your health care provider about having an MRI and a mammogram every year.  Breast cancer gene (BRCA) assessment is recommended for women who have family members with BRCA-related cancers. BRCA-related cancers include:  Breast.  Ovarian.  Tubal.  Peritoneal cancers.  Results of the assessment will determine the need for genetic counseling and BRCA1 and BRCA2 testing. Cervical Cancer Routine pelvic examinations to screen for cervical cancer are no longer recommended for nonpregnant women who are considered low risk for cancer of the pelvic organs (ovaries, uterus, and vagina) and who do not have symptoms. A pelvic examination may be necessary if you have symptoms including those associated with pelvic infections. Ask your health care provider if a screening pelvic exam is right for you.   The Pap test is the screening test for cervical cancer for women who are considered at risk.  If you had a hysterectomy for a problem that was not cancer or a condition that could lead to cancer, then you no longer need Pap tests.  If you are older than 65 years, and you have had normal Pap tests for the past 10 years, you no longer need to have Pap tests.  If you have had past treatment for cervical cancer or a condition that could lead to cancer, you need Pap tests and screening for cancer for at least 20 years after your treatment.  If you no longer get a Pap test, assess your risk factors if they change (such as having a new sexual partner). This can affect whether you should start being screened again.  Some women have medical problems that increase their chance of getting cervical cancer. If this is the case  for you, your health care provider may recommend more frequent screening and Pap tests.  The human papillomavirus (HPV) test is another test that may be used for cervical cancer screening. The HPV test looks for the virus that can cause cell changes in the cervix. The cells collected during the Pap test can be tested for HPV.  The HPV test can be used to screen women 23 years of age and older. Getting tested for HPV can extend the interval between normal Pap tests from three to five years.  An HPV test also should be used to screen women of any age who have unclear Pap test results.  After 64 years of age, women should have HPV testing as often as Pap tests.  Colorectal Cancer  This type of cancer can be detected and often prevented.  Routine colorectal cancer screening usually begins at 64  years of age and continues through 64 years of age.  Your health care provider may recommend screening at an earlier age if you have risk factors for colon cancer.  Your health care provider may also recommend using home test kits to check for hidden blood in the stool.  A small camera at the end of a tube can be used to examine your colon directly (sigmoidoscopy or colonoscopy). This is done to check for the earliest forms of colorectal cancer.  Routine screening usually begins at age 74.  Direct examination of the colon should be repeated every 5-10 years through 64 years of age. However, you may need to be screened more often if early forms of precancerous polyps or small growths are found. Skin Cancer  Check your skin from head to toe regularly.  Tell your health care provider about any new moles or changes in moles, especially if there is a change in a mole's shape or color.  Also tell your health care provider if you have a mole that is larger than the size of a pencil eraser.  Always use sunscreen. Apply sunscreen liberally and repeatedly throughout the day.  Protect yourself by wearing  long sleeves, pants, a wide-brimmed hat, and sunglasses whenever you are outside. HEART DISEASE, DIABETES, AND HIGH BLOOD PRESSURE   Have your blood pressure checked at least every 1-2 years. High blood pressure causes heart disease and increases the risk of stroke.  If you are between 58 years and 79 years old, ask your health care provider if you should take aspirin to prevent strokes.  Have regular diabetes screenings. This involves taking a blood sample to check your fasting blood sugar level.  If you are at a normal weight and have a low risk for diabetes, have this test once every three years after 64 years of age.  If you are overweight and have a high risk for diabetes, consider being tested at a younger age or more often. PREVENTING INFECTION  Hepatitis B  If you have a higher risk for hepatitis B, you should be screened for this virus. You are considered at high risk for hepatitis B if:  You were born in a country where hepatitis B is common. Ask your health care provider which countries are considered high risk.  Your parents were born in a high-risk country, and you have not been immunized against hepatitis B (hepatitis B vaccine).  You have HIV or AIDS.  You use needles to inject street drugs.  You live with someone who has hepatitis B.  You have had sex with someone who has hepatitis B.  You get hemodialysis treatment.  You take certain medicines for conditions, including cancer, organ transplantation, and autoimmune conditions. Hepatitis C  Blood testing is recommended for:  Everyone born from [birth year] through 1966.  Anyone with known risk factors for hepatitis C. Sexually transmitted infections (STIs)  You should be screened for sexually transmitted infections (STIs) including gonorrhea and chlamydia if:  You are sexually active and are younger than 64 years of age.  You are older than 64 years of age and your health care provider tells you that you are at  risk for this type of infection.  Your sexual activity has changed since you were last screened and you are at an increased risk for chlamydia or gonorrhea. Ask your health care provider if you are at risk.  If you do not have HIV, but are at risk, it may be recommended that you  take a prescription medicine daily to prevent HIV infection. This is called pre-exposure prophylaxis (PrEP). You are considered at risk if:  You are sexually active and do not regularly use condoms or know the HIV status of your partner(s).  You take drugs by injection.  You are sexually active with a partner who has HIV. Talk with your health care provider about whether you are at high risk of being infected with HIV. If you choose to begin PrEP, you should first be tested for HIV. You should then be tested every 3 months for as long as you are taking PrEP.  PREGNANCY   If you are premenopausal and you may become pregnant, ask your health care provider about preconception counseling.  If you may become pregnant, take 400 to 800 micrograms (mcg) of folic acid every day.  If you want to prevent pregnancy, talk to your health care provider about birth control (contraception). OSTEOPOROSIS AND MENOPAUSE   Osteoporosis is a disease in which the bones lose minerals and strength with aging. This can result in serious bone fractures. Your risk for osteoporosis can be identified using a bone density scan.  If you are 29 years of age or older, or if you are at risk for osteoporosis and fractures, ask your health care provider if you should be screened.  Ask your health care provider whether you should take a calcium or vitamin D supplement to lower your risk for osteoporosis.  Menopause may have certain physical symptoms and risks.  Hormone replacement therapy may reduce some of these symptoms and risks. Talk to your health care provider about whether hormone replacement therapy is right for you.  HOME CARE INSTRUCTIONS    Schedule regular health, dental, and eye exams.  Stay current with your immunizations.   Do not use any tobacco products including cigarettes, chewing tobacco, or electronic cigarettes.  If you are pregnant, do not drink alcohol.  If you are breastfeeding, limit how much and how often you drink alcohol.  Limit alcohol intake to no more than 1 drink per day for nonpregnant women. One drink equals 12 ounces of beer, 5 ounces of wine, or 1 ounces of hard liquor.  Do not use street drugs.  Do not share needles.  Ask your health care provider for help if you need support or information about quitting drugs.  Tell your health care provider if you often feel depressed.  Tell your health care provider if you have ever been abused or do not feel safe at home. Document Released: 02/19/2011 Document Revised: 12/21/2013 Document Reviewed: 07/08/2013 [facility] Patient Information 2015 [place], [place]. This information is not intended to replace advice given to you by your health care provider. Make sure you discuss any questions you have with your health care provider.

## 2014-04-17 ENCOUNTER — Encounter: Payer: Self-pay | Admitting: Internal Medicine

## 2014-04-18 DIAGNOSIS — M545 Low back pain, unspecified: Secondary | ICD-10-CM | POA: Insufficient documentation

## 2014-04-18 NOTE — Assessment & Plan Note (Signed)
recommending annual mammogram at [facility] wit history of microcalifications  on 2012 exam.

## 2014-04-18 NOTE — Assessment & Plan Note (Addendum)
Will treat with daily  NSAIDs, prn MR and analgesics and behavior modificaiton for two weeks,.  If no improvement, plain films have been discussed

## 2014-04-18 NOTE — Assessment & Plan Note (Signed)
Managed optimally with vagifem tablets, which is requiring PA per patient.  Will start PA process

## 2014-04-18 NOTE — Assessment & Plan Note (Signed)
Reviewed prior CT films and Ca 125 to rule out ovarian CA as cause for bloating.

## 2014-04-18 NOTE — Assessment & Plan Note (Signed)
Annual comprehensive exam was done including breast, excluding pelvic and PAP smear. All screenings have been addressed and a printed health maintenance schedule was given to patient.   

## 2014-04-19 NOTE — Telephone Encounter (Signed)
Mailed unread message to pt  

## 2014-12-29 ENCOUNTER — Other Ambulatory Visit: Payer: Self-pay | Admitting: Internal Medicine

## 2014-12-29 ENCOUNTER — Encounter: Payer: Self-pay | Admitting: Internal Medicine

## 2014-12-29 DIAGNOSIS — N390 Urinary tract infection, site not specified: Secondary | ICD-10-CM | POA: Insufficient documentation

## 2014-12-29 MED ORDER — CIPROFLOXACIN HCL 250 MG PO TABS: 250.0000 mg | ORAL_TABLET | Freq: Two times a day (BID) | ORAL | Status: DC

## 2014-12-29 NOTE — Telephone Encounter (Signed)
Please advise 

## 2014-12-31 ENCOUNTER — Ambulatory Visit: Payer: PRIVATE HEALTH INSURANCE | Admitting: Internal Medicine

## 2015-03-18 DIAGNOSIS — Z6822 Body mass index (BMI) 22.0-22.9, adult: Secondary | ICD-10-CM | POA: Diagnosis not present

## 2015-03-18 DIAGNOSIS — Z1231 Encounter for screening mammogram for malignant neoplasm of breast: Secondary | ICD-10-CM | POA: Diagnosis not present

## 2015-03-18 DIAGNOSIS — Z124 Encounter for screening for malignant neoplasm of cervix: Secondary | ICD-10-CM | POA: Diagnosis not present

## 2015-03-18 DIAGNOSIS — M816 Localized osteoporosis [Lequesne]: Secondary | ICD-10-CM | POA: Diagnosis not present

## 2015-03-18 DIAGNOSIS — N958 Other specified menopausal and perimenopausal disorders: Secondary | ICD-10-CM | POA: Diagnosis not present

## 2015-03-18 DIAGNOSIS — N362 Urethral caruncle: Secondary | ICD-10-CM | POA: Diagnosis not present

## 2015-03-18 LAB — HM DEXA SCAN

## 2015-04-20 ENCOUNTER — Ambulatory Visit (INDEPENDENT_AMBULATORY_CARE_PROVIDER_SITE_OTHER): Payer: Medicare Other | Admitting: Internal Medicine

## 2015-04-20 ENCOUNTER — Encounter: Payer: Self-pay | Admitting: Internal Medicine

## 2015-04-20 VITALS — BP 126/70 | HR 62 | Temp 97.9°F | Resp 12 | Ht 65.25 in | Wt 131.5 lb

## 2015-04-20 DIAGNOSIS — Z113 Encounter for screening for infections with a predominantly sexual mode of transmission: Secondary | ICD-10-CM

## 2015-04-20 DIAGNOSIS — K219 Gastro-esophageal reflux disease without esophagitis: Secondary | ICD-10-CM | POA: Diagnosis not present

## 2015-04-20 DIAGNOSIS — E785 Hyperlipidemia, unspecified: Secondary | ICD-10-CM | POA: Diagnosis not present

## 2015-04-20 DIAGNOSIS — Z23 Encounter for immunization: Secondary | ICD-10-CM

## 2015-04-20 DIAGNOSIS — Z0000 Encounter for general adult medical examination without abnormal findings: Secondary | ICD-10-CM

## 2015-04-20 DIAGNOSIS — R634 Abnormal weight loss: Secondary | ICD-10-CM

## 2015-04-20 LAB — COMPREHENSIVE METABOLIC PANEL
ALBUMIN: 4.3 g/dL (ref 3.5–5.2)
ALK PHOS: 55 U/L (ref 39–117)
ALT: 12 U/L (ref 0–35)
AST: 19 U/L (ref 0–37)
BUN: 11 mg/dL (ref 6–23)
CALCIUM: 9.7 mg/dL (ref 8.4–10.5)
CHLORIDE: 101 meq/L (ref 96–112)
CO2: 29 mEq/L (ref 19–32)
CREATININE: 0.77 mg/dL (ref 0.40–1.20)
GFR: 79.91 mL/min (ref >60.00)
Glucose, Bld: 90 mg/dL (ref 70–99)
Potassium: 4.6 mEq/L (ref 3.5–5.1)
SODIUM: 139 meq/L (ref 135–145)
TOTAL PROTEIN: 7.1 g/dL (ref 6.0–8.3)
Total Bilirubin: 0.6 mg/dL (ref 0.2–1.2)

## 2015-04-20 NOTE — Progress Notes (Signed)
Pre-visit discussion using our clinic review tool. No additional management support is needed unless otherwise documented below in the visit note.  

## 2015-04-20 NOTE — Progress Notes (Signed)
Patient ID: Kimberly Gill, female    DOB: 12-Sep-1950  Age: 64 y.o. MRN: [id number]    Review of Systems  Objective:    Physical Exam

## 2015-04-20 NOTE — Assessment & Plan Note (Signed)
Annual comprehensive preventive exam was done   During the course of the visit the patient was educated and counseled about appropriate screening and preventive services including :  diabetes screening, lipid analysis with projected  10 year  risk for CAD  Which is 4.7 % using the Framingham risk calculator for women, , nutrition counseling, colorectal cancer screening, and recommended immunizations.  Printed recommendations for health maintenance screenings was given.

## 2015-04-20 NOTE — Progress Notes (Addendum)
Patient ID: Kimberly Gill, female    DOB: 1951-06-29  Age: 64 y.o. MRN: [id number]  The patient is here for her welcome to Medicare  wellness examination and management of other chronic and acute problems.   The risk factors are reflected in the social history.  The roster of all physicians providing medical care to patient - is listed in the Snapshot section of the chart.  Activities of daily living:  The patient is 100% independent in all ADLs: dressing, toileting, feeding as well as independent mobility  Home safety : The patient has smoke detectors in the home. They wear seatbelts.  There are no firearms at home. There is no violence in the home.   There is no risks for hepatitis, STDs or HIV. There is no   history of blood transfusion. They have no travel history to infectious disease endemic areas of the world.  The patient has seen their dentist in the last six month. They have seen their eye doctor in the last year. They have no  hearing difficulty .  They do not  have excessive sun exposure. Discussed the need for sun protection: hats, long sleeves and use of sunscreen if there is significant sun exposure.   Diet: the importance of a healthy diet is discussed. They do have a healthy diet.  The benefits of regular aerobic exercise were discussed. She walks 4 times per week ,  20 minutes.   Depression screen: there are no signs or vegative symptoms of depression- irritability, change in appetite, anhedonia, sadness/tearfullness.  Cognitive assessment: the patient manages all their financial and personal affairs and is actively engaged. They could relate day,date,year and events; recalled 2/3 objects at 3 minutes; performed clock-face test normally.  The following portions of the patient's history were reviewed and updated as appropriate: allergies, current medications, past family history, past medical history,  past surgical history, past social history  and problem list.  Visual  acuity was not assessed per patient preference since she has regular follow up with her ophthalmologist. Hearing and body mass index were assessed and reviewed.   During the course of the visit the patient was educated and counseled about appropriate screening and preventive services including : fall prevention , diabetes screening, nutrition counseling, colorectal cancer screening, and recommended immunizations  During the course of the visit , End of Life objectives were discussed at length,  Patient does not have a living will in place or a healthcare power of attorney.  She was given printed information about advance directives and encouraged to return after discussing with her family,   CC: The primary encounter diagnosis was Hyperlipidemia. Diagnoses of Loss of weight, Screen for STD (sexually transmitted disease), Encounter for immunization, Need for prophylactic vaccination against Streptococcus pneumoniae (pneumococcus), Initial Medicare annual wellness visit, and Gastroesophageal reflux disease without esophagitis were also pertinent to this visit.  History Kimberly Gill has no past medical history on file.   She has past surgical history that includes Bunionectomy.   Her family history includes Alcohol abuse in her sister; Hypertension in her father and mother; Mental illness in her sister; Mental retardation in her father; Osteoporosis in her mother.She reports that she has never smoked. She has never used smokeless tobacco. She reports that she drinks alcohol. She reports that she does not use illicit drugs.  Outpatient Prescriptions Prior to Visit  Medication Sig Dispense Refill  . calcium citrate-vitamin D (CITRACAL+D) 315-200 MG-UNIT per tablet Take 1 tablet by mouth 2 (two) times  daily.    . estradiol (ESTRACE) 0.1 MG/GM vaginal cream Place 1 Applicatorful vaginally at bedtime.    . Estradiol (VAGIFEM) 10 MCG TABS vaginal tablet 1 tablet inserted vaginally every night for 2 weeks,  Then  twice weekly therafter 22 tablet 0  . Multiple Vitamin (MULTIVITAMIN) tablet Take 1 tablet by mouth daily.    Marland Kitchen zolpidem (AMBIEN) 10 MG tablet Take 1 tablet (10 mg total) by mouth at bedtime as needed. 30 tablet 5  . ALPRAZolam (XANAX) 0.25 MG tablet Take 1 tablet (0.25 mg total) by mouth at bedtime as needed for sleep. 30 tablet 2  . celecoxib (CELEBREX) 400 MG capsule Take 1 capsule (400 mg total) by mouth daily after breakfast. (Patient not taking: Reported on 04/20/2015) 30 capsule 1  . ciprofloxacin (CIPRO) 250 MG tablet Take 1 tablet (250 mg total) by mouth 2 (two) times daily. 10 tablet 0  . esomeprazole (NEXIUM) 40 MG capsule Take 1 capsule (40 mg total) by mouth daily before breakfast. 30 capsule 5  . fluconazole (DIFLUCAN) 150 MG tablet Take 1 tablet (150 mg total) by mouth daily. 2 tablet 0  . HYDROcodone-acetaminophen (NORCO/VICODIN) 5-325 MG per tablet Take 1 tablet by mouth at bedtime as needed and may repeat dose one time if needed for moderate pain. (Patient not taking: Reported on 04/20/2015) 60 tablet 0  . meloxicam (MOBIC) 15 MG tablet Take 1 tablet (15 mg total) by mouth daily. 30 tablet 1  . methocarbamol (ROBAXIN) 500 MG tablet Take 1 tablet (500 mg total) by mouth every 8 (eight) hours as needed for muscle spasms. 60 tablet 0   No facility-administered medications prior to visit.    Review of Systems   Patient denies headache, fevers, malaise, unintentional weight loss, skin rash, eye pain, sinus congestion and sinus pain, sore throat, dysphagia,  hemoptysis , cough, dyspnea, wheezing, chest pain, palpitations, orthopnea, edema, abdominal pain, nausea, melena, diarrhea, constipation, flank pain, dysuria, hematuria, urinary  Frequency, nocturia, numbness, tingling, seizures,  Focal weakness, Loss of consciousness,  Tremor, insomnia, depression, anxiety, and suicidal ideation.      Objective:  BP 126/70 mmHg  Pulse 62  Temp(Src) 97.9 F (36.6 C) (Oral)  Resp 12  Ht 5'  5.25" (1.657 m)  Wt 131 lb 8 oz (59.648 kg)  BMI 21.72 kg/m2  SpO2 99%  Physical Exam  General appearance: alert, cooperative and appears stated age Head: Normocephalic, without obvious abnormality, atraumatic Eyes: conjunctivae/corneas clear. PERRL, EOM's intact. Fundi benign. Ears: normal TM's and external ear canals both ears Nose: Nares normal. Septum midline. Mucosa normal. No drainage or sinus tenderness. Throat: lips, mucosa, and tongue normal; teeth and gums normal Neck: no adenopathy, no carotid bruit, no JVD, supple, symmetrical, trachea midline and thyroid not enlarged, symmetric, no tenderness/mass/nodules Lungs: clear to auscultation bilaterally Heart: regular rate and rhythm, S1, S2 normal, no murmur, click, rub or gallop Abdomen: soft, non-tender; bowel sounds normal; no masses,  no organomegaly Extremities: extremities normal, atraumatic, no cyanosis or edema Pulses: 2+ and symmetric Skin: Skin color, texture, turgor normal. No rashes or lesions Neurologic: Alert and oriented X 3, normal strength and tone. Normal symmetric reflexes. Normal coordination and gait.   Assessment & Plan:   Problem List Items Addressed This Visit      Unprioritized   GERD (gastroesophageal reflux disease)    Daily symptoms have resolved with weight loss.  Discussed current controversy regarding prolonged use of PPI in patients without documented Barretts esophagus.   Suggested trial  of pepcid 20 mg prn.       Initial Medicare annual wellness visit    Annual comprehensive preventive exam was done   During the course of the visit the patient was educated and counseled about appropriate screening and preventive services including :  diabetes screening, lipid analysis with projected  10 year  risk for CAD  Which is 4.7 % using the Framingham risk calculator for women, , nutrition counseling, colorectal cancer screening, and recommended immunizations.  Printed recommendations for health  maintenance screenings was given.        Other Visit Diagnoses    Hyperlipidemia    -  Primary    Relevant Orders    Lipid panel    Loss of weight        Relevant Orders    CBC with Differential/Platelet    TSH    Comprehensive metabolic panel (Completed)    Screen for STD (sexually transmitted disease)        Relevant Orders    Hepatitis C antibody    HIV antibody    Encounter for immunization        Need for prophylactic vaccination against Streptococcus pneumoniae (pneumococcus)        Relevant Orders    Pneumococcal conjugate vaccine 13-valent (Completed)       I have discontinued Ms. Bryant's esomeprazole, ALPRAZolam, fluconazole, celecoxib, meloxicam, HYDROcodone-acetaminophen, methocarbamol, and ciprofloxacin. I am also having her maintain her multivitamin, calcium citrate-vitamin D, Estradiol, zolpidem, and estradiol.  No orders of the defined types were placed in this encounter.    Medications Discontinued During This Encounter  Medication Reason  . celecoxib (CELEBREX) 400 MG capsule Patient Preference  . ciprofloxacin (CIPRO) 250 MG tablet Completed Course  . esomeprazole (NEXIUM) 40 MG capsule Patient Preference  . fluconazole (DIFLUCAN) 150 MG tablet Completed Course  . HYDROcodone-acetaminophen (NORCO/VICODIN) 5-325 MG per tablet Patient Preference  . meloxicam (MOBIC) 15 MG tablet Patient Preference  . methocarbamol (ROBAXIN) 500 MG tablet Patient Preference  . ALPRAZolam (XANAX) 0.25 MG tablet     Follow-up: No Follow-up on file.   Crecencio Mc, MD

## 2015-04-20 NOTE — Patient Instructions (Signed)

## 2015-04-20 NOTE — Assessment & Plan Note (Signed)
Daily symptoms have resolved with weight loss.  Discussed current controversy regarding prolonged use of PPI in patients without documented Barretts esophagus.   Suggested trial of pepcid 20 mg prn.

## 2015-04-21 LAB — HEPATITIS C ANTIBODY: HCV Ab: NEGATIVE

## 2015-04-21 LAB — HIV ANTIBODY (ROUTINE TESTING W REFLEX): HIV 1&2 Ab, 4th Generation: NONREACTIVE

## 2015-04-22 ENCOUNTER — Other Ambulatory Visit: Payer: Medicare Other

## 2015-04-22 DIAGNOSIS — E785 Hyperlipidemia, unspecified: Secondary | ICD-10-CM | POA: Diagnosis not present

## 2015-04-22 LAB — CBC WITH DIFFERENTIAL/PLATELET
Basophils Absolute: 0.1 10*3/uL (ref 0.0–0.1)
Basophils Relative: 1 % (ref 0–1)
EOS ABS: 0.1 10*3/uL (ref 0.0–0.7)
EOS PCT: 2 % (ref 0–5)
HCT: 41.2 % (ref 36.0–46.0)
Hemoglobin: 13.2 g/dL (ref 12.0–15.0)
LYMPHS ABS: 1.8 10*3/uL (ref 0.7–4.0)
Lymphocytes Relative: 30 % (ref 12–46)
MCH: 30.6 pg (ref 26.0–34.0)
MCHC: 32 g/dL (ref 30.0–36.0)
MCV: 95.6 fL (ref 78.0–100.0)
MONOS PCT: 9 % (ref 3–12)
MPV: 12.2 fL (ref 8.6–12.4)
Monocytes Absolute: 0.5 10*3/uL (ref 0.1–1.0)
Neutro Abs: 3.4 10*3/uL (ref 1.7–7.7)
Neutrophils Relative %: 58 % (ref 43–77)
PLATELETS: 269 10*3/uL (ref 150–400)
RBC: 4.31 MIL/uL (ref 3.87–5.11)
RDW: 14.8 % (ref 11.5–15.5)
WBC: 5.9 10*3/uL (ref 4.0–10.5)

## 2015-04-22 LAB — LIPID PANEL
CHOLESTEROL: 213 mg/dL — AB (ref 0–200)
HDL: 70.5 mg/dL (ref >39.00)
LDL CALC: 130 mg/dL — AB (ref 0–99)
NonHDL: 142.05
Total CHOL/HDL Ratio: 3
Triglycerides: 59 mg/dL (ref 0.0–149.0)
VLDL: 11.8 mg/dL (ref 0.0–40.0)

## 2015-04-22 LAB — TSH: TSH: 3.51 u[IU]/mL (ref 0.35–4.50)

## 2015-04-22 NOTE — Addendum Note (Signed)
Addended by: Karlene Einstein D on: 04/22/2015 08:48 AM   Modules accepted: Orders

## 2015-04-23 ENCOUNTER — Encounter: Payer: Self-pay | Admitting: Internal Medicine

## 2015-04-27 NOTE — Addendum Note (Signed)
Addended by: Crecencio Mc on: 04/27/2015 01:31 PM   Modules accepted: Miquel Dunn

## 2015-10-07 ENCOUNTER — Encounter: Payer: Self-pay | Admitting: Internal Medicine

## 2015-10-10 ENCOUNTER — Encounter: Payer: Self-pay | Admitting: Internal Medicine

## 2015-10-10 ENCOUNTER — Other Ambulatory Visit: Payer: Self-pay

## 2015-10-10 NOTE — Telephone Encounter (Signed)
Please advise, Last seen in August 2016.

## 2015-10-11 MED ORDER — ZOLPIDEM TARTRATE 10 MG PO TABS: 10.0000 mg | ORAL_TABLET | Freq: Every evening | ORAL | Status: DC | PRN

## 2015-10-11 NOTE — Telephone Encounter (Signed)
Refill for 30 days only.  OFFICE VISIT NEEDED prior to any more refills 

## 2015-12-25 DIAGNOSIS — R350 Frequency of micturition: Secondary | ICD-10-CM | POA: Diagnosis not present

## 2015-12-27 DIAGNOSIS — N952 Postmenopausal atrophic vaginitis: Secondary | ICD-10-CM | POA: Diagnosis not present

## 2015-12-27 DIAGNOSIS — N390 Urinary tract infection, site not specified: Secondary | ICD-10-CM | POA: Diagnosis not present

## 2015-12-27 DIAGNOSIS — N760 Acute vaginitis: Secondary | ICD-10-CM | POA: Diagnosis not present

## 2015-12-29 ENCOUNTER — Other Ambulatory Visit: Payer: Self-pay | Admitting: Internal Medicine

## 2015-12-29 ENCOUNTER — Encounter: Payer: Self-pay | Admitting: Internal Medicine

## 2015-12-29 MED ORDER — FLUCONAZOLE 150 MG PO TABS: 150.0000 mg | ORAL_TABLET | Freq: Every day | ORAL | Status: DC

## 2016-01-25 DIAGNOSIS — L814 Other melanin hyperpigmentation: Secondary | ICD-10-CM | POA: Diagnosis not present

## 2016-01-25 DIAGNOSIS — D1801 Hemangioma of skin and subcutaneous tissue: Secondary | ICD-10-CM | POA: Diagnosis not present

## 2016-01-25 DIAGNOSIS — X32XXXA Exposure to sunlight, initial encounter: Secondary | ICD-10-CM | POA: Diagnosis not present

## 2016-03-16 DIAGNOSIS — H02409 Unspecified ptosis of unspecified eyelid: Secondary | ICD-10-CM | POA: Diagnosis not present

## 2016-03-21 DIAGNOSIS — Z124 Encounter for screening for malignant neoplasm of cervix: Secondary | ICD-10-CM | POA: Diagnosis not present

## 2016-03-21 DIAGNOSIS — Z6820 Body mass index (BMI) 20.0-20.9, adult: Secondary | ICD-10-CM | POA: Diagnosis not present

## 2016-03-21 DIAGNOSIS — Z1231 Encounter for screening mammogram for malignant neoplasm of breast: Secondary | ICD-10-CM | POA: Diagnosis not present

## 2016-03-28 ENCOUNTER — Other Ambulatory Visit: Payer: Self-pay | Admitting: Internal Medicine

## 2016-03-28 ENCOUNTER — Encounter: Payer: Self-pay | Admitting: *Deleted

## 2016-03-28 NOTE — Telephone Encounter (Signed)
FLUCONAZOLE REFILLED. IF SYMPTOMS PERSIST,T PLEASE OFFER APPT

## 2016-03-28 NOTE — Telephone Encounter (Signed)
Sent pt MyChart notifying her

## 2016-03-28 NOTE — Telephone Encounter (Signed)
Last OV 04/20/15... Upcoming appt 8//31/17... Okay to refill?

## 2016-04-19 ENCOUNTER — Ambulatory Visit (INDEPENDENT_AMBULATORY_CARE_PROVIDER_SITE_OTHER): Payer: Medicare Other | Admitting: Internal Medicine

## 2016-04-19 ENCOUNTER — Encounter: Payer: Self-pay | Admitting: Internal Medicine

## 2016-04-19 VITALS — BP 116/70 | HR 66 | Temp 97.5°F | Resp 12 | Ht 65.0 in | Wt 125.8 lb

## 2016-04-19 DIAGNOSIS — E785 Hyperlipidemia, unspecified: Secondary | ICD-10-CM | POA: Diagnosis not present

## 2016-04-19 DIAGNOSIS — Z23 Encounter for immunization: Secondary | ICD-10-CM | POA: Diagnosis not present

## 2016-04-19 DIAGNOSIS — Z0000 Encounter for general adult medical examination without abnormal findings: Secondary | ICD-10-CM

## 2016-04-19 DIAGNOSIS — E559 Vitamin D deficiency, unspecified: Secondary | ICD-10-CM

## 2016-04-19 DIAGNOSIS — L659 Nonscarring hair loss, unspecified: Secondary | ICD-10-CM | POA: Insufficient documentation

## 2016-04-19 DIAGNOSIS — N952 Postmenopausal atrophic vaginitis: Secondary | ICD-10-CM

## 2016-04-19 LAB — COMPREHENSIVE METABOLIC PANEL
ALT: 14 U/L (ref 0–35)
AST: 22 U/L (ref 0–37)
Albumin: 4.4 g/dL (ref 3.5–5.2)
Alkaline Phosphatase: 42 U/L (ref 39–117)
BUN: 12 mg/dL (ref 6–23)
CHLORIDE: 104 meq/L (ref 96–112)
CO2: 30 meq/L (ref 19–32)
CREATININE: 0.77 mg/dL (ref 0.40–1.20)
Calcium: 9.3 mg/dL (ref 8.4–10.5)
GFR: 79.67 mL/min (ref >60.00)
Glucose, Bld: 97 mg/dL (ref 70–99)
Potassium: 4 mEq/L (ref 3.5–5.1)
SODIUM: 139 meq/L (ref 135–145)
Total Bilirubin: 0.6 mg/dL (ref 0.2–1.2)
Total Protein: 7.3 g/dL (ref 6.0–8.3)

## 2016-04-19 LAB — CBC WITH DIFFERENTIAL/PLATELET
BASOS PCT: 0.9 % (ref 0.0–3.0)
Basophils Absolute: 0 10*3/uL (ref 0.0–0.1)
EOS ABS: 0.1 10*3/uL (ref 0.0–0.7)
EOS PCT: 1.3 % (ref 0.0–5.0)
HEMATOCRIT: 40.2 % (ref 36.0–46.0)
HEMOGLOBIN: 13.4 g/dL (ref 12.0–15.0)
LYMPHS PCT: 43.1 % (ref 12.0–46.0)
Lymphs Abs: 2.1 10*3/uL (ref 0.7–4.0)
MCHC: 33.5 g/dL (ref 30.0–36.0)
MCV: 92.9 fl (ref 78.0–100.0)
Monocytes Absolute: 0.5 10*3/uL (ref 0.1–1.0)
Monocytes Relative: 9.7 % (ref 3.0–12.0)
Neutro Abs: 2.2 10*3/uL (ref 1.4–7.7)
Neutrophils Relative %: 45 % (ref 43.0–77.0)
Platelets: 229 10*3/uL (ref 150.0–400.0)
RBC: 4.32 Mil/uL (ref 3.87–5.11)
RDW: 14.6 % (ref 11.5–15.5)
WBC: 4.8 10*3/uL (ref 4.0–10.5)

## 2016-04-19 LAB — IBC PANEL
IRON: 101 ug/dL (ref 42–145)
Saturation Ratios: 29.2 % (ref 20.0–50.0)
Transferrin: 247 mg/dL (ref 212.0–360.0)

## 2016-04-19 LAB — VITAMIN D 25 HYDROXY (VIT D DEFICIENCY, FRACTURES): VITD: 35.33 ng/mL (ref 30.00–100.00)

## 2016-04-19 LAB — TSH: TSH: 4.33 u[IU]/mL (ref 0.35–4.50)

## 2016-04-19 MED ORDER — FLUCONAZOLE 150 MG PO TABS: 150.0000 mg | ORAL_TABLET | Freq: Every day | ORAL | 2 refills | Status: DC

## 2016-04-19 NOTE — Progress Notes (Signed)
Patient ID: Kimberly Gill, female    DOB: 02-18-1951  Age: 65 y.o. MRN: [id number]  The patient is here for annual Medicare wellness examination and management of other chronic and acute problems.    PAP NORMAL Mar 29 2016 MAMMOGRAM NORMAL Mar 26 2016 [name]  Due for colonoscopy.  Cologuard discussed DEXA  2016  T -2.3 SPINE HIPS -1.8 FH of osteoporosis in  mother  Who died this year at 42 of CHF,  The risk factors are reflected in the social history.  The roster of all physicians providing medical care to patient - is listed in the Snapshot section of the chart.  Activities of daily living:  The patient is 100% independent in all ADLs: dressing, toileting, feeding as well as independent mobility  Home safety : The patient has smoke detectors in the home. They wear seatbelts.  There are no firearms at home. There is no violence in the home.   There is no risks for hepatitis, STDs or HIV. There is no   history of blood transfusion. They have no travel history to infectious disease endemic areas of the world.  The patient has seen their dentist in the last six month. They have seen their eye doctor in the last year. They admit to slight hearing difficulty with regard to whispered voices and some television programs.  They have deferred audiologic testing in the last year.  They do not  have excessive sun exposure. Discussed the need for sun protection: hats, long sleeves and use of sunscreen if there is significant sun exposure.   Diet: the importance of a healthy diet is discussed. They do have a healthy diet.  The benefits of regular aerobic exercise were discussed. She walks 4 times per week ,  20 minutes.   Depression screen: there are no signs or vegative symptoms of depression- irritability, change in appetite, anhedonia, sadness/tearfullness.  Cognitive assessment: the patient manages all their financial and personal affairs and is actively engaged. They could relate  day,date,year and events; recalled 2/3 objects at 3 minutes; performed clock-face test normally.  The following portions of the patient's history were reviewed and updated as appropriate: allergies, current medications, past family history, past medical history,  past surgical history, past social history  and problem list.  Visual acuity was not assessed per patient preference since she has regular follow up with her ophthalmologist. Hearing and body mass index were assessed and reviewed.   During the course of the visit the patient was educated and counseled about appropriate screening and preventive services including : fall prevention , diabetes screening, nutrition counseling, colorectal cancer screening, and recommended immunizations.    CC: The primary encounter diagnosis was Hyperlipidemia. Diagnoses of Alopecia, Vitamin D deficiency, Encounter for immunization, Postmenopausal atrophic vaginitis, and Medicare annual wellness visit, subsequent were also pertinent to this visit.  Ptosis of left eyelid,  Has seen eye doctor   Grief  Over loss of mother. She is planning on going to Hospice for grief support .  Sleeping well  Hair is thinnig   Want to check iron and thyroid.   45 minutes spent   History Kimberly Gill has no past medical history on file.   She has a past surgical history that includes Bunionectomy.   Her family history includes Alcohol abuse in her sister; Hypertension in her father and mother; Mental illness in her sister; Mental retardation in her father; Osteoporosis in her mother.She reports that she has never smoked. She has  never used smokeless tobacco. She reports that she drinks alcohol. She reports that she does not use drugs.  Outpatient Medications Prior to Visit  Medication Sig Dispense Refill  . calcium citrate-vitamin D (CITRACAL+D) 315-200 MG-UNIT per tablet Take 1 tablet by mouth 2 (two) times daily.    Marland Kitchen estradiol (ESTRACE) 0.1 MG/GM vaginal cream Place 1  Applicatorful vaginally at bedtime.    . Estradiol (VAGIFEM) 10 MCG TABS vaginal tablet 1 tablet inserted vaginally every night for 2 weeks,  Then twice weekly therafter 22 tablet 0  . Multiple Vitamin (MULTIVITAMIN) tablet Take 1 tablet by mouth daily.    Marland Kitchen zolpidem (AMBIEN) 10 MG tablet Take 1 tablet (10 mg total) by mouth at bedtime as needed. 30 tablet 0  . fluconazole (DIFLUCAN) 150 MG tablet TAKE 1 TABLET BY MOUTH EVERY DAY 2 tablet 0   No facility-administered medications prior to visit.     Review of Systems   Patient denies headache, fevers, malaise, unintentional weight loss, skin rash, eye pain, sinus congestion and sinus pain, sore throat, dysphagia,  hemoptysis , cough, dyspnea, wheezing, chest pain, palpitations, orthopnea, edema, abdominal pain, nausea, melena, diarrhea, constipation, flank pain, dysuria, hematuria, urinary  Frequency, nocturia, numbness, tingling, seizures,  Focal weakness, Loss of consciousness,  Tremor, insomnia, depression, anxiety, and suicidal ideation.      Objective:  BP 116/70   Pulse 66   Temp 97.5 F (36.4 C) (Oral)   Resp 12   Ht 5\' 5"  (1.651 m)   Wt 125 lb 12 oz (57 kg)   SpO2 97%   BMI 20.93 kg/m   Physical Exam   General appearance: alert, cooperative and appears stated age Head: Normocephalic, without obvious abnormality, atraumatic Eyes: conjunctivae/corneas clear. PERRL, EOM's intact. Fundi benign. Ears: normal TM's and external ear canals both ears Nose: Nares normal. Septum midline. Mucosa normal. No drainage or sinus tenderness. Throat: lips, mucosa, and tongue normal; teeth and gums normal Neck: no adenopathy, no carotid bruit, no JVD, supple, symmetrical, trachea midline and thyroid not enlarged, symmetric, no tenderness/mass/nodules Lungs: clear to auscultation bilaterally Breasts: normal appearance, no masses or tenderness Heart: regular rate and rhythm, S1, S2 normal, no murmur, click, rub or gallop Abdomen: soft,  non-tender; bowel sounds normal; no masses,  no organomegaly Extremities: extremities normal, atraumatic, no cyanosis or edema Pulses: 2+ and symmetric Skin: Skin color, texture, turgor normal. No rashes or lesions Neurologic: Alert and oriented X 3, normal strength and tone. Normal symmetric reflexes. Normal coordination and gait.     Assessment & Plan:   Problem List Items Addressed This Visit    Medicare annual wellness visit, subsequent    Annual Medicare wellness  exam was done as well as a comprehensive physical exam and management of acute and chronic conditions .  During the course of the visit the patient was educated and counseled about appropriate screening and preventive services including : fall prevention , diabetes screening, nutrition counseling, colorectal cancer screening, and recommended immunizations.  Printed recommendations for health maintenance screenings was given.   Lab Results  Component Value Date   TSH 4.33 04/19/2016   Lab Results  Component Value Date   CHOL 213 (H) 04/20/2015   HDL 114 04/19/2016   LDLCALC 130 (H) 04/20/2015   LDLDIRECT 113 04/19/2016   TRIG 55 04/19/2016   CHOLHDL 1.9 04/19/2016         Postmenopausal atrophic vaginitis    With some itching reported on a frequent basis without discharge.  Advised  her to change undergarments more frequently if moist, and to avoid tight fitting clothes at bedtime      Alopecia    Likely from emotional stress secondary to loss of mother [place] THYROID AND IRON STORES   Lab Results  Component Value Date   TSH 4.33 04/19/2016   Lab Results  Component Value Date   IRON 101 04/19/2016         Relevant Orders   Comprehensive metabolic panel (Completed)   TSH (Completed)   IBC panel (Completed)   CBC with Differential/Platelet (Completed)    Other Visit Diagnoses    Hyperlipidemia    -  Primary   Relevant Orders   Lipid Panel w/Direct LDL:HDL Ratio (Completed)   Vitamin D deficiency        Relevant Orders   VITAMIN D 25 Hydroxy (Vit-D Deficiency, Fractures) (Completed)   Encounter for immunization       Relevant Orders   Flu vaccine HIGH DOSE PF (Completed)      I have changed Kimberly Gill's fluconazole. I am also having her maintain her multivitamin, calcium citrate-vitamin D, Estradiol, estradiol, and zolpidem.  Meds ordered this encounter  Medications  . fluconazole (DIFLUCAN) 150 MG tablet    Sig: Take 1 tablet (150 mg total) by mouth daily.    Dispense:  2 tablet    Refill:  2    Keep on file for future refills    Medications Discontinued During This Encounter  Medication Reason  . fluconazole (DIFLUCAN) 150 MG tablet Reorder    Follow-up: No Follow-up on file.   Crecencio Mc, MD

## 2016-04-19 NOTE — Patient Instructions (Addendum)
Try the Schiff chocolate flavored probiotic  No underwear at bedtime  Change underwear  frequently if you sweat   Cologuard wil be ordered for your colon CA screening     I  recommend getting the majority of your calcium and Vitamin D  through diet rather than supplements given the recent association of calcium supplements with increased coronary artery calcium scores,. You need 1200 to 1800 mg daily  Menopause is a normal process in which your reproductive ability comes to an end. This process happens gradually over a span of months to years, usually between the ages of 88 and 64. Menopause is complete when you have missed 12 consecutive menstrual periods. It is important to talk with your health care provider about some of the most common conditions that affect postmenopausal women, such as heart disease, cancer, and bone loss (osteoporosis). Adopting a healthy lifestyle and getting preventive care can help to promote your health and wellness. Those actions can also lower your chances of developing some of these common conditions. WHAT SHOULD I KNOW ABOUT MENOPAUSE? During menopause, you may experience a number of symptoms, such as:  Moderate-to-severe hot flashes.  Night sweats.  Decrease in sex drive.  Mood swings.  Headaches.  Tiredness.  Irritability.  Memory problems.  Insomnia. Choosing to treat or not to treat menopausal changes is an individual decision that you make with your health care provider. WHAT SHOULD I KNOW ABOUT HORMONE REPLACEMENT THERAPY AND SUPPLEMENTS? Hormone therapy products are effective for treating symptoms that are associated with menopause, such as hot flashes and night sweats. Hormone replacement carries certain risks, especially as you become older. If you are thinking about using estrogen or estrogen with progestin treatments, discuss the benefits and risks with your health care provider. WHAT SHOULD I KNOW ABOUT HEART DISEASE AND  STROKE? Heart disease, heart attack, and stroke become more likely as you age. This may be due, in part, to the hormonal changes that your body experiences during menopause. These can affect how your body processes dietary fats, triglycerides, and cholesterol. Heart attack and stroke are both medical emergencies. There are many things that you can do to help prevent heart disease and stroke:  Have your blood pressure checked at least every 1-2 years. High blood pressure causes heart disease and increases the risk of stroke.  If you are 89-77 years old, ask your health care provider if you should take aspirin to prevent a heart attack or a stroke.  Do not use any tobacco products, including cigarettes, chewing tobacco, or electronic cigarettes. If you need help quitting, ask your health care provider.  It is important to eat a healthy diet and maintain a healthy weight.  Be sure to include plenty of vegetables, fruits, low-fat dairy products, and lean protein.  Avoid eating foods that are high in solid fats, added sugars, or salt (sodium).  Get regular exercise. This is one of the most important things that you can do for your health.  Try to exercise for at least 150 minutes each week. The type of exercise that you do should increase your heart rate and make you sweat. This is known as moderate-intensity exercise.  Try to do strengthening exercises at least twice each week. Do these in addition to the moderate-intensity exercise.  Know your numbers.Ask your health care provider to check your cholesterol and your blood glucose. Continue to have your blood tested as directed by your health care provider. WHAT SHOULD I KNOW ABOUT CANCER SCREENING? There  are several types of cancer. Take the following steps to reduce your risk and to catch any cancer development as early as possible. Breast Cancer  Practice breast self-awareness.  This means understanding how your breasts normally appear  and feel.  It also means doing regular breast self-exams. Let your health care provider know about any changes, no matter how small.  If you are 54 or older, have a clinician do a breast exam (clinical breast exam or CBE) every year. Depending on your age, family history, and medical history, it may be recommended that you also have a yearly breast X-ray (mammogram).  If you have a family history of breast cancer, talk with your health care provider about genetic screening.  If you are at high risk for breast cancer, talk with your health care provider about having an MRI and a mammogram every year.  Breast cancer (BRCA) gene test is recommended for women who have family members with BRCA-related cancers. Results of the assessment will determine the need for genetic counseling and BRCA1 and for BRCA2 testing. BRCA-related cancers include these types:  Breast. This occurs in males or females.  Ovarian.  Tubal. This may also be called fallopian tube cancer.  Cancer of the abdominal or pelvic lining (peritoneal cancer).  Prostate.  Pancreatic. Cervical, Uterine, and Ovarian Cancer Your health care provider may recommend that you be screened regularly for cancer of the pelvic organs. These include your ovaries, uterus, and vagina. This screening involves a pelvic exam, which includes checking for microscopic changes to the surface of your cervix (Pap test).  For women ages 21-65, health care providers may recommend a pelvic exam and a Pap test every three years. For women ages 67-65, they may recommend the Pap test and pelvic exam, combined with testing for human papilloma virus (HPV), every five years. Some types of HPV increase your risk of cervical cancer. Testing for HPV may also be done on women of any age who have unclear Pap test results.  Other health care providers may not recommend any screening for nonpregnant women who are considered low risk for pelvic cancer and have no  symptoms. Ask your health care provider if a screening pelvic exam is right for you.  If you have had past treatment for cervical cancer or a condition that could lead to cancer, you need Pap tests and screening for cancer for at least 20 years after your treatment. If Pap tests have been discontinued for you, your risk factors (such as having a new sexual partner) need to be reassessed to determine if you should start having screenings again. Some women have medical problems that increase the chance of getting cervical cancer. In these cases, your health care provider may recommend that you have screening and Pap tests more often.  If you have a family history of uterine cancer or ovarian cancer, talk with your health care provider about genetic screening.  If you have vaginal bleeding after reaching menopause, tell your health care provider.  There are currently no reliable tests available to screen for ovarian cancer. Lung Cancer Lung cancer screening is recommended for adults 87-62 years old who are at high risk for lung cancer because of a history of smoking. A yearly low-dose CT scan of the lungs is recommended if you:  Currently smoke.  Have a history of at least 30 pack-years of smoking and you currently smoke or have quit within the past 15 years. A pack-year is smoking an average of one  pack of cigarettes per day for one year. Yearly screening should:  Continue until it has been 15 years since you quit.  Stop if you develop a health problem that would prevent you from having lung cancer treatment. Colorectal Cancer  This type of cancer can be detected and can often be prevented.  Routine colorectal cancer screening usually begins at age 65 and continues through age 30.  If you have risk factors for colon cancer, your health care provider may recommend that you be screened at an earlier age.  If you have a family history of colorectal cancer, talk with your health care provider  about genetic screening.  Your health care provider may also recommend using home test kits to check for hidden blood in your stool.  A small camera at the end of a tube can be used to examine your colon directly (sigmoidoscopy or colonoscopy). This is done to check for the earliest forms of colorectal cancer.  Direct examination of the colon should be repeated every 5-10 years until age 35. However, if early forms of precancerous polyps or small growths are found or if you have a family history or genetic risk for colorectal cancer, you may need to be screened more often. Skin Cancer  Check your skin from head to toe regularly.  Monitor any moles. Be sure to tell your health care provider:  About any new moles or changes in moles, especially if there is a change in a mole's shape or color.  If you have a mole that is larger than the size of a pencil eraser.  If any of your family members has a history of skin cancer, especially at a young age, talk with your health care provider about genetic screening.  Always use sunscreen. Apply sunscreen liberally and repeatedly throughout the day.  Whenever you are outside, protect yourself by wearing long sleeves, pants, a wide-brimmed hat, and sunglasses. WHAT SHOULD I KNOW ABOUT OSTEOPOROSIS? Osteoporosis is a condition in which bone destruction happens more quickly than new bone creation. After menopause, you may be at an increased risk for osteoporosis. To help prevent osteoporosis or the bone fractures that can happen because of osteoporosis, the following is recommended:  If you are 71-35 years old, get at least 1,000 mg of calcium and at least 600 mg of vitamin D per day.  If you are older than age 16 but younger than age 43, get at least 1,200 mg of calcium and at least 600 mg of vitamin D per day.  If you are older than age 45, get at least 1,200 mg of calcium and at least 800 mg of vitamin D per day. Smoking and excessive alcohol intake  increase the risk of osteoporosis. Eat foods that are rich in calcium and vitamin D, and do weight-bearing exercises several times each week as directed by your health care provider. WHAT SHOULD I KNOW ABOUT HOW MENOPAUSE AFFECTS [place]? Depression may occur at any age, but it is more common as you become older. Common symptoms of depression include:  Low or sad mood.  Changes in sleep patterns.  Changes in appetite or eating patterns.  Feeling an overall lack of motivation or enjoyment of activities that you previously enjoyed.  Frequent crying spells. Talk with your health care provider if you think that you are experiencing depression. WHAT SHOULD I KNOW ABOUT IMMUNIZATIONS? It is important that you get and maintain your immunizations. These include:  Tetanus, diphtheria, and pertussis (Tdap) booster  vaccine.  Influenza every year before the flu season begins.  Pneumonia vaccine.  Shingles vaccine. Your health care provider may also recommend other immunizations.   This information is not intended to replace advice given to you by your health care provider. Make sure you discuss any questions you have with your health care provider.   Document Released: 09/28/2005 Document Revised: 08/27/2014 Document Reviewed: 04/08/2014 Elsevier Interactive Patient Education [employer].

## 2016-04-19 NOTE — Assessment & Plan Note (Addendum)
Likely from emotional stress secondary to loss of mother [facility] THYROID AND IRON STORES   Lab Results  Component Value Date   TSH 4.33 04/19/2016   Lab Results  Component Value Date   IRON 101 04/19/2016

## 2016-04-19 NOTE — Progress Notes (Signed)
Pre-visit discussion using our clinic review tool. No additional management support is needed unless otherwise documented below in the visit note.  

## 2016-04-20 LAB — LIPID PANEL W/DIRECT LDL/HDL RATIO
CHOL/HDL RATIO: 1.9 ratio (ref <=5.0)
Cholesterol: 214 mg/dL — ABNORMAL HIGH (ref 125–200)
HDL: 114 mg/dL (ref >=46)
LDL DIRECT: 113 mg/dL (ref <130)
LDL/HDL RATIO (DIRECT LDL): 1 ratio
Triglycerides: 55 mg/dL (ref <150)

## 2016-04-22 NOTE — Assessment & Plan Note (Addendum)
Annual Medicare wellness  exam was done as well as a comprehensive physical exam and management of acute and chronic conditions .  During the course of the visit the patient was educated and counseled about appropriate screening and preventive services including : fall prevention , diabetes screening, nutrition counseling, colorectal cancer screening, and recommended immunizations.  Printed recommendations for health maintenance screenings was given.   Lab Results  Component Value Date   TSH 4.33 04/19/2016   Lab Results  Component Value Date   CHOL 213 (H) 04/20/2015   HDL 114 04/19/2016   LDLCALC 130 (H) 04/20/2015   LDLDIRECT 113 04/19/2016   TRIG 55 04/19/2016   CHOLHDL 1.9 04/19/2016

## 2016-04-22 NOTE — Assessment & Plan Note (Signed)
With some itching reported on a frequent basis without discharge.  Advised her to change undergarments more frequently if moist, and to avoid tight fitting clothes at bedtime

## 2016-04-23 ENCOUNTER — Encounter: Payer: Self-pay | Admitting: Internal Medicine

## 2016-04-24 NOTE — Progress Notes (Signed)
Cologuard has been ordered. 

## 2016-05-07 DIAGNOSIS — Z1212 Encounter for screening for malignant neoplasm of rectum: Secondary | ICD-10-CM | POA: Diagnosis not present

## 2016-05-07 DIAGNOSIS — Z1211 Encounter for screening for malignant neoplasm of colon: Secondary | ICD-10-CM | POA: Diagnosis not present

## 2016-05-07 LAB — COLOGUARD: COLOGUARD: POSITIVE

## 2016-05-15 ENCOUNTER — Telehealth: Payer: Self-pay | Admitting: Internal Medicine

## 2016-05-15 DIAGNOSIS — R195 Other fecal abnormalities: Secondary | ICD-10-CM

## 2016-05-15 NOTE — Telephone Encounter (Signed)
cologuard abstracted positive. In results folder

## 2016-05-15 NOTE — Telephone Encounter (Signed)
The results of patient's cologuard is  Positive. This may indicate a polyp somewhere in the colon.  I  would like to refer her   to GI for further evaluation .    Is she   willing to see one and does he have a preference?

## 2016-05-16 NOTE — Telephone Encounter (Signed)
Patient notified

## 2016-05-16 NOTE — Telephone Encounter (Signed)
Your referral is in process as requested. Our referral coordinator will call you when the appointment has been made.  

## 2016-05-16 NOTE — Telephone Encounter (Signed)
Patient notified and voiced understanding patient would like to see Dr. Tiffany Kocher.

## 2016-05-17 DIAGNOSIS — H02402 Unspecified ptosis of left eyelid: Secondary | ICD-10-CM | POA: Diagnosis not present

## 2016-05-21 ENCOUNTER — Encounter: Payer: Self-pay | Admitting: Internal Medicine

## 2016-05-22 NOTE — Telephone Encounter (Signed)
Have you received a referral for colonoscopy on this patient?

## 2016-06-14 DIAGNOSIS — R195 Other fecal abnormalities: Secondary | ICD-10-CM | POA: Diagnosis not present

## 2016-07-24 ENCOUNTER — Encounter: Payer: Self-pay | Admitting: Unknown Physician Specialty

## 2016-07-24 ENCOUNTER — Encounter: Payer: Self-pay | Admitting: Internal Medicine

## 2016-07-24 DIAGNOSIS — K648 Other hemorrhoids: Secondary | ICD-10-CM | POA: Diagnosis not present

## 2016-07-24 DIAGNOSIS — K640 First degree hemorrhoids: Secondary | ICD-10-CM | POA: Diagnosis not present

## 2016-07-24 DIAGNOSIS — D126 Benign neoplasm of colon, unspecified: Secondary | ICD-10-CM | POA: Diagnosis not present

## 2016-07-24 DIAGNOSIS — K5730 Diverticulosis of large intestine without perforation or abscess without bleeding: Secondary | ICD-10-CM | POA: Diagnosis not present

## 2016-07-24 DIAGNOSIS — D124 Benign neoplasm of descending colon: Secondary | ICD-10-CM | POA: Diagnosis not present

## 2016-07-24 DIAGNOSIS — Z1211 Encounter for screening for malignant neoplasm of colon: Secondary | ICD-10-CM | POA: Diagnosis not present

## 2016-07-24 DIAGNOSIS — K635 Polyp of colon: Secondary | ICD-10-CM | POA: Diagnosis not present

## 2016-09-12 ENCOUNTER — Encounter: Payer: Self-pay | Admitting: Internal Medicine

## 2016-09-14 DIAGNOSIS — H1045 Other chronic allergic conjunctivitis: Secondary | ICD-10-CM | POA: Diagnosis not present

## 2016-11-13 ENCOUNTER — Encounter: Payer: Self-pay | Admitting: Family

## 2016-11-13 ENCOUNTER — Ambulatory Visit (INDEPENDENT_AMBULATORY_CARE_PROVIDER_SITE_OTHER): Payer: Medicare Other | Admitting: Family

## 2016-11-13 VITALS — BP 114/66 | HR 80 | Temp 97.4°F | Ht 65.0 in | Wt 135.2 lb

## 2016-11-13 DIAGNOSIS — Z23 Encounter for immunization: Secondary | ICD-10-CM | POA: Diagnosis not present

## 2016-11-13 DIAGNOSIS — R300 Dysuria: Secondary | ICD-10-CM | POA: Diagnosis not present

## 2016-11-13 LAB — POCT URINALYSIS DIPSTICK
Bilirubin, UA: NEGATIVE
Blood, UA: NEGATIVE
Glucose, UA: NEGATIVE
KETONES UA: NEGATIVE
NITRITE UA: NEGATIVE
PH UA: 5 (ref 5.0–8.0)
PROTEIN UA: NEGATIVE
Spec Grav, UA: 1.01 (ref 1.030–1.035)
UROBILINOGEN UA: 0.2 (ref ?–2.0)

## 2016-11-13 MED ORDER — PHENAZOPYRIDINE HCL 100 MG PO TABS: 100.0000 mg | ORAL_TABLET | Freq: Three times a day (TID) | ORAL | 0 refills | Status: DC | PRN

## 2016-11-13 MED ORDER — PNEUMOCOCCAL VAC POLYVALENT 25 MCG/0.5ML IJ INJ
0.5000 mL | INJECTION | Freq: Once | INTRAMUSCULAR | Status: AC
  Administered 2016-11-13: 0.5 mL via INTRAMUSCULAR

## 2016-11-13 NOTE — Progress Notes (Signed)
Subjective:    Patient ID: Kimberly Gill, female    DOB: 07/08/1951, 66 y.o.   MRN: [id number]  CC: Kimberly Gill is a 66 y.o. female who presents today for an acute visit.    HPI: Dysuria and low back pain x 3 days, slightly better today.   No recent UTI. Endorses 'little nausea', fatigue. No vomiting, flank pain. Tried azo and advil with no relief.  No h/o renal stones      HISTORY:  No past medical history on file. Past Surgical History:  Procedure Laterality Date  . BUNIONECTOMY     Family History  Problem Relation Age of Onset  . Hypertension Mother   . Osteoporosis Mother   . Hypertension Father   . Mental retardation Father     dementia  . Alcohol abuse Sister   . Mental illness Sister     Allergies: Sulfa antibiotics; Penicillins; and Sulfa drugs cross reactors Current Outpatient Prescriptions on File Prior to Visit  Medication Sig Dispense Refill  . calcium citrate-vitamin D (CITRACAL+D) 315-200 MG-UNIT per tablet Take 1 tablet by mouth 2 (two) times daily.    Marland Kitchen estradiol (ESTRACE) 0.1 MG/GM vaginal cream Place 1 Applicatorful vaginally at bedtime.    . Estradiol (VAGIFEM) 10 MCG TABS vaginal tablet 1 tablet inserted vaginally every night for 2 weeks,  Then twice weekly therafter 22 tablet 0  . fluconazole (DIFLUCAN) 150 MG tablet Take 1 tablet (150 mg total) by mouth daily. 2 tablet 2  . Multiple Vitamin (MULTIVITAMIN) tablet Take 1 tablet by mouth daily.    Marland Kitchen zolpidem (AMBIEN) 10 MG tablet Take 1 tablet (10 mg total) by mouth at bedtime as needed. 30 tablet 0   No current facility-administered medications on file prior to visit.     Social History  Substance Use Topics  . Smoking status: Never Smoker  . Smokeless tobacco: Never Used  . Alcohol use Yes    Review of Systems  Constitutional: Negative for chills and fever.  Respiratory: Negative for cough.   Cardiovascular: Negative for chest pain and palpitations.  Gastrointestinal: Negative for  nausea and vomiting.  Genitourinary: Positive for dysuria. Negative for flank pain and frequency.      Objective:    BP 114/66   Pulse 80   Temp 97.4 F (36.3 C) (Oral)   Ht 5\' 5"  (1.651 m)   Wt 135 lb 3.2 oz (61.3 kg)   SpO2 98%   BMI 22.50 kg/m    Physical Exam  Constitutional: She appears well-developed and well-nourished.  Cardiovascular: Normal rate, regular rhythm, normal heart sounds and normal pulses.   Pulmonary/Chest: Effort normal and breath sounds normal. She has no wheezes. She has no rhonchi. She has no rales.  Abdominal: There is no CVA tenderness.  Neurological: She is alert.  Skin: Skin is warm and dry.  Psychiatric: She has a normal mood and affect. Her speech is normal and behavior is normal. Thought content normal.  Vitals reviewed.      Assessment & Plan:   1. Dysuria Trace leukocytes. Afebrile. Patient and I agreed to await urine culture.   - POCT Urinalysis Dipstick - Urine Culture - phenazopyridine (PYRIDIUM) 100 MG tablet; Take 1 tablet (100 mg total) by mouth 3 (three) times daily as needed for pain.  Dispense: 8 tablet; Refill: 0  2. Need for pneumococcal vaccination Had prevnar 2016. Due for pneumococcal then PNA vaccines will be complete.  - pneumococcal 23 valent vaccine (PNU-IMMUNE) injection 0.5  mL; Inject 0.5 mLs into the muscle once.    I am having Ms. Shevchenko start on phenazopyridine. I am also having her maintain her multivitamin, calcium citrate-vitamin D, Estradiol, estradiol, zolpidem, and fluconazole.   Meds ordered this encounter  Medications  . phenazopyridine (PYRIDIUM) 100 MG tablet    Sig: Take 1 tablet (100 mg total) by mouth 3 (three) times daily as needed for pain.    Dispense:  8 tablet    Refill:  0    Order Specific Question:   Supervising Provider    Answer:   Crecencio Mc [[id number]]    Return precautions given.   Risks, benefits, and alternatives of the medications and treatment plan prescribed today were  discussed, and patient expressed understanding.   Education regarding symptom management and diagnosis given to patient on AVS.  Continue to follow with TULLO, Aris Everts, MD for routine health maintenance.   Mel[place] Abts and I agreed with plan.   Mable Paris, FNP

## 2016-11-13 NOTE — Progress Notes (Signed)
Pre visit review using our clinic review tool, if applicable. No additional management support is needed unless otherwise documented below in the visit note. 

## 2016-11-13 NOTE — Patient Instructions (Signed)
Plenty of water  Lets await urine culture  Pyridium for discomfort

## 2016-11-14 LAB — URINE CULTURE: ORGANISM ID, BACTERIA: NO GROWTH

## 2017-01-11 DIAGNOSIS — H0011 Chalazion right upper eyelid: Secondary | ICD-10-CM | POA: Diagnosis not present

## 2017-01-24 DIAGNOSIS — H00013 Hordeolum externum right eye, unspecified eyelid: Secondary | ICD-10-CM | POA: Diagnosis not present

## 2017-01-29 ENCOUNTER — Telehealth: Payer: Self-pay | Admitting: Internal Medicine

## 2017-01-29 NOTE — Telephone Encounter (Signed)
Left pt message to call back and confirm extension of appt on 04/24/17 to include AWV.   Ebony Hail call back # [phone]

## 2017-02-27 DIAGNOSIS — H0014 Chalazion left upper eyelid: Secondary | ICD-10-CM | POA: Diagnosis not present

## 2017-03-15 NOTE — Telephone Encounter (Signed)
Left pt message to call back and confirm extension of appt on 04/24/17 to include AWV.   [place] call back [phone]

## 2017-03-30 LAB — HM MAMMOGRAPHY: HM MAMMO: NORMAL (ref 0–4)

## 2017-04-10 ENCOUNTER — Other Ambulatory Visit: Payer: Self-pay | Admitting: Internal Medicine

## 2017-04-10 MED ORDER — ZOLPIDEM TARTRATE 10 MG PO TABS: 10.0000 mg | ORAL_TABLET | Freq: Every evening | ORAL | 0 refills | Status: DC | PRN

## 2017-04-10 NOTE — Telephone Encounter (Signed)
Refilled: 10/11/2015 Last OV: 11/13/2016 Next OV: 04/24/2017

## 2017-04-10 NOTE — Telephone Encounter (Signed)
Refill for 30 days only.  OFFICE VISIT NEEDED prior to any more refills 

## 2017-04-10 NOTE — Telephone Encounter (Signed)
Pt called needing a new Rx for zolpidem (AMBIEN) 10 MG tablet. Pt does have an appt on 04/24/2017. Please advise?  Pharmacy is [id number] - [address], [place]  Call pt @ [phone]. Thank you!

## 2017-04-11 NOTE — Telephone Encounter (Signed)
Printed, signed and faxed.  

## 2017-04-24 ENCOUNTER — Encounter: Payer: Self-pay | Admitting: Internal Medicine

## 2017-04-24 ENCOUNTER — Ambulatory Visit (INDEPENDENT_AMBULATORY_CARE_PROVIDER_SITE_OTHER): Payer: Medicare Other

## 2017-04-24 ENCOUNTER — Ambulatory Visit (INDEPENDENT_AMBULATORY_CARE_PROVIDER_SITE_OTHER): Payer: Medicare Other | Admitting: Internal Medicine

## 2017-04-24 VITALS — BP 108/74 | HR 76 | Temp 97.8°F | Resp 15 | Ht 65.0 in | Wt 138.0 lb

## 2017-04-24 VITALS — BP 108/74 | HR 76 | Temp 97.8°F | Resp 15 | Ht 65.0 in | Wt 138.8 lb

## 2017-04-24 DIAGNOSIS — Z23 Encounter for immunization: Secondary | ICD-10-CM | POA: Diagnosis not present

## 2017-04-24 DIAGNOSIS — E785 Hyperlipidemia, unspecified: Secondary | ICD-10-CM | POA: Diagnosis not present

## 2017-04-24 DIAGNOSIS — L659 Nonscarring hair loss, unspecified: Secondary | ICD-10-CM | POA: Diagnosis not present

## 2017-04-24 DIAGNOSIS — Z0000 Encounter for general adult medical examination without abnormal findings: Secondary | ICD-10-CM

## 2017-04-24 DIAGNOSIS — E559 Vitamin D deficiency, unspecified: Secondary | ICD-10-CM | POA: Diagnosis not present

## 2017-04-24 DIAGNOSIS — F5101 Primary insomnia: Secondary | ICD-10-CM

## 2017-04-24 DIAGNOSIS — R5383 Other fatigue: Secondary | ICD-10-CM

## 2017-04-24 LAB — CBC WITH DIFFERENTIAL/PLATELET
BASOS ABS: 0.1 10*3/uL (ref 0.0–0.1)
Basophils Relative: 1.1 % (ref 0.0–3.0)
EOS ABS: 0.1 10*3/uL (ref 0.0–0.7)
Eosinophils Relative: 1.2 % (ref 0.0–5.0)
HEMATOCRIT: 42.9 % (ref 36.0–46.0)
Hemoglobin: 14.1 g/dL (ref 12.0–15.0)
LYMPHS PCT: 46.6 % — AB (ref 12.0–46.0)
Lymphs Abs: 2.4 10*3/uL (ref 0.7–4.0)
MCHC: 32.9 g/dL (ref 30.0–36.0)
MCV: 94.1 fl (ref 78.0–100.0)
MONO ABS: 0.6 10*3/uL (ref 0.1–1.0)
Monocytes Relative: 10.8 % (ref 3.0–12.0)
NEUTROS PCT: 40.3 % — AB (ref 43.0–77.0)
Neutro Abs: 2.1 10*3/uL (ref 1.4–7.7)
PLATELETS: 249 10*3/uL (ref 150.0–400.0)
RBC: 4.56 Mil/uL (ref 3.87–5.11)
RDW: 14.3 % (ref 11.5–15.5)
WBC: 5.1 10*3/uL (ref 4.0–10.5)

## 2017-04-24 LAB — COMPREHENSIVE METABOLIC PANEL
ALK PHOS: 46 U/L (ref 39–117)
ALT: 11 U/L (ref 0–35)
AST: 20 U/L (ref 0–37)
Albumin: 4.5 g/dL (ref 3.5–5.2)
BILIRUBIN TOTAL: 0.8 mg/dL (ref 0.2–1.2)
BUN: 11 mg/dL (ref 6–23)
CALCIUM: 10 mg/dL (ref 8.4–10.5)
CO2: 27 mEq/L (ref 19–32)
Chloride: 102 mEq/L (ref 96–112)
Creatinine, Ser: 0.77 mg/dL (ref 0.40–1.20)
GFR: 79.42 mL/min (ref >60.00)
GLUCOSE: 99 mg/dL (ref 70–99)
POTASSIUM: 4.3 meq/L (ref 3.5–5.1)
Sodium: 138 mEq/L (ref 135–145)
TOTAL PROTEIN: 7.4 g/dL (ref 6.0–8.3)

## 2017-04-24 LAB — LIPID PANEL
CHOL/HDL RATIO: 3
Cholesterol: 237 mg/dL — ABNORMAL HIGH (ref 0–200)
HDL: 93.8 mg/dL (ref >39.00)
LDL Cholesterol: 131 mg/dL — ABNORMAL HIGH (ref 0–99)
NONHDL: 143.42
TRIGLYCERIDES: 64 mg/dL (ref 0.0–149.0)
VLDL: 12.8 mg/dL (ref 0.0–40.0)

## 2017-04-24 LAB — VITAMIN D 25 HYDROXY (VIT D DEFICIENCY, FRACTURES): VITD: 37.25 ng/mL (ref 30.00–100.00)

## 2017-04-24 LAB — TSH: TSH: 4.82 u[IU]/mL — AB (ref 0.35–4.50)

## 2017-04-24 MED ORDER — ESZOPICLONE 3 MG PO TABS: 3.0000 mg | ORAL_TABLET | Freq: Every day | ORAL | 4 refills | Status: DC

## 2017-04-24 MED ORDER — FLUCONAZOLE 150 MG PO TABS: 150.0000 mg | ORAL_TABLET | Freq: Every day | ORAL | 2 refills | Status: DC

## 2017-04-24 NOTE — Patient Instructions (Addendum)
We discussed natural remedies for managing insomnia including herbal tea containing valeria root and ,  Chamomile, and OTC oral supplements including up to 6 mg melatonin daily .    Turn off I phones and I pads 2 hours before you want to be asleep  No caffeine after 12 noon  Trial of lunesta if the natural remedies fail (ok to combine them if it is needed)    colonoscopy will due again  IN 2022    Health Maintenance for Postmenopausal Women Menopause is a normal process in which your reproductive ability comes to an end. This process happens gradually over a span of months to years, usually between the ages of 25 and 49. Menopause is complete when you have missed 12 consecutive menstrual periods. It is important to talk with your health care provider about some of the most common conditions that affect postmenopausal women, such as heart disease, cancer, and bone loss (osteoporosis). Adopting a healthy lifestyle and getting preventive care can help to promote your health and wellness. Those actions can also lower your chances of developing some of these common conditions. What should I know about menopause? During menopause, you may experience a number of symptoms, such as:  Moderate-to-severe hot flashes.  Night sweats.  Decrease in sex drive.  Mood swings.  Headaches.  Tiredness.  Irritability.  Memory problems.  Insomnia.  Choosing to treat or not to treat menopausal changes is an individual decision that you make with your health care provider. What should I know about hormone replacement therapy and supplements? Hormone therapy products are effective for treating symptoms that are associated with menopause, such as hot flashes and night sweats. Hormone replacement carries certain risks, especially as you become older. If you are thinking about using estrogen or estrogen with progestin treatments, discuss the benefits and risks with your health care provider. What should I  know about heart disease and stroke? Heart disease, heart attack, and stroke become more likely as you age. This may be due, in part, to the hormonal changes that your body experiences during menopause. These can affect how your body processes dietary fats, triglycerides, and cholesterol. Heart attack and stroke are both medical emergencies. There are many things that you can do to help prevent heart disease and stroke:  Have your blood pressure checked at least every 1-2 years. High blood pressure causes heart disease and increases the risk of stroke.  If you are 87-27 years old, ask your health care provider if you should take aspirin to prevent a heart attack or a stroke.  Do not use any tobacco products, including cigarettes, chewing tobacco, or electronic cigarettes. If you need help quitting, ask your health care provider.  It is important to eat a healthy diet and maintain a healthy weight. ? Be sure to include plenty of vegetables, fruits, low-fat dairy products, and lean protein. ? Avoid eating foods that are high in solid fats, added sugars, or salt (sodium).  Get regular exercise. This is one of the most important things that you can do for your health. ? Try to exercise for at least 150 minutes each week. The type of exercise that you do should increase your heart rate and make you sweat. This is known as moderate-intensity exercise. ? Try to do strengthening exercises at least twice each week. Do these in addition to the moderate-intensity exercise.  Know your numbers.Ask your health care provider to check your cholesterol and your blood glucose. Continue to have your blood  tested as directed by your health care provider.  What should I know about cancer screening? There are several types of cancer. Take the following steps to reduce your risk and to catch any cancer development as early as possible. Breast Cancer  Practice breast self-awareness. ? This means understanding how  your breasts normally appear and feel. ? It also means doing regular breast self-exams. Let your health care provider know about any changes, no matter how small.  If you are 30 or older, have a clinician do a breast exam (clinical breast exam or CBE) every year. Depending on your age, family history, and medical history, it may be recommended that you also have a yearly breast X-ray (mammogram).  If you have a family history of breast cancer, talk with your health care provider about genetic screening.  If you are at high risk for breast cancer, talk with your health care provider about having an MRI and a mammogram every year.  Breast cancer (BRCA) gene test is recommended for women who have family members with BRCA-related cancers. Results of the assessment will determine the need for genetic counseling and BRCA1 and for BRCA2 testing. BRCA-related cancers include these types: ? Breast. This occurs in males or females. ? Ovarian. ? Tubal. This may also be called fallopian tube cancer. ? Cancer of the abdominal or pelvic lining (peritoneal cancer). ? Prostate. ? Pancreatic.  Cervical, Uterine, and Ovarian Cancer Your health care provider may recommend that you be screened regularly for cancer of the pelvic organs. These include your ovaries, uterus, and vagina. This screening involves a pelvic exam, which includes checking for microscopic changes to the surface of your cervix (Pap test).  For women ages 21-65, health care providers may recommend a pelvic exam and a Pap test every three years. For women ages 23-65, they may recommend the Pap test and pelvic exam, combined with testing for human papilloma virus (HPV), every five years. Some types of HPV increase your risk of cervical cancer. Testing for HPV may also be done on women of any age who have unclear Pap test results.  Other health care providers may not recommend any screening for nonpregnant women who are considered low risk for  pelvic cancer and have no symptoms. Ask your health care provider if a screening pelvic exam is right for you.  If you have had past treatment for cervical cancer or a condition that could lead to cancer, you need Pap tests and screening for cancer for at least 20 years after your treatment. If Pap tests have been discontinued for you, your risk factors (such as having a new sexual partner) need to be reassessed to determine if you should start having screenings again. Some women have medical problems that increase the chance of getting cervical cancer. In these cases, your health care provider may recommend that you have screening and Pap tests more often.  If you have a family history of uterine cancer or ovarian cancer, talk with your health care provider about genetic screening.  If you have vaginal bleeding after reaching menopause, tell your health care provider.  There are currently no reliable tests available to screen for ovarian cancer.  Lung Cancer Lung cancer screening is recommended for adults 50-60 years old who are at high risk for lung cancer because of a history of smoking. A yearly low-dose CT scan of the lungs is recommended if you:  Currently smoke.  Have a history of at least 30 pack-years of smoking and  you currently smoke or have quit within the past 15 years. A pack-year is smoking an average of one pack of cigarettes per day for one year.  Yearly screening should:  Continue until it has been 15 years since you quit.  Stop if you develop a health problem that would prevent you from having lung cancer treatment.  Colorectal Cancer  This type of cancer can be detected and can often be prevented.  Routine colorectal cancer screening usually begins at age 14 and continues through age 46.  If you have risk factors for colon cancer, your health care provider may recommend that you be screened at an earlier age.  If you have a family history of colorectal cancer, talk  with your health care provider about genetic screening.  Your health care provider may also recommend using home test kits to check for hidden blood in your stool.  A small camera at the end of a tube can be used to examine your colon directly (sigmoidoscopy or colonoscopy). This is done to check for the earliest forms of colorectal cancer.  Direct examination of the colon should be repeated every 5-10 years until age 31. However, if early forms of precancerous polyps or small growths are found or if you have a family history or genetic risk for colorectal cancer, you may need to be screened more often.  Skin Cancer  Check your skin from head to toe regularly.  Monitor any moles. Be sure to tell your health care provider: ? About any new moles or changes in moles, especially if there is a change in a mole's shape or color. ? If you have a mole that is larger than the size of a pencil eraser.  If any of your family members has a history of skin cancer, especially at a young age, talk with your health care provider about genetic screening.  Always use sunscreen. Apply sunscreen liberally and repeatedly throughout the day.  Whenever you are outside, protect yourself by wearing long sleeves, pants, a wide-brimmed hat, and sunglasses.  What should I know about osteoporosis? Osteoporosis is a condition in which bone destruction happens more quickly than new bone creation. After menopause, you may be at an increased risk for osteoporosis. To help prevent osteoporosis or the bone fractures that can happen because of osteoporosis, the following is recommended:  If you are 85-25 years old, get at least 1,000 mg of calcium and at least 600 mg of vitamin D per day.  If you are older than age 82 but younger than age 79, get at least 1,200 mg of calcium and at least 600 mg of vitamin D per day.  If you are older than age 56, get at least 1,200 mg of calcium and at least 800 mg of vitamin D per  day.  Smoking and excessive alcohol intake increase the risk of osteoporosis. Eat foods that are rich in calcium and vitamin D, and do weight-bearing exercises several times each week as directed by your health care provider. What should I know about how menopause affects my mental health? Depression may occur at any age, but it is more common as you become older. Common symptoms of depression include:  Low or sad mood.  Changes in sleep patterns.  Changes in appetite or eating patterns.  Feeling an overall lack of motivation or enjoyment of activities that you previously enjoyed.  Frequent crying spells.  Talk with your health care provider if you think that you are experiencing depression.  What should I know about immunizations? It is important that you get and maintain your immunizations. These include:  Tetanus, diphtheria, and pertussis (Tdap) booster vaccine.  Influenza every year before the flu season begins.  Pneumonia vaccine.  Shingles vaccine.  Your health care provider may also recommend other immunizations. This information is not intended to replace advice given to you by your health care provider. Make sure you discuss any questions you have with your health care provider. Document Released: 09/28/2005 Document Revised: 02/24/2016 Document Reviewed: 05/10/2015 Elsevier Interactive Patient Education  2018 [employer].

## 2017-04-24 NOTE — Progress Notes (Signed)
Patient ID: ANALEAH BRAME, female    DOB: July 18, 1951  Age: 66 y.o. MRN: [id number]  The patient is here for annual physical  examination and management of other chronic and acute problems.   Sees GYN for mammogram , bone density ([facility] ) and pelvic exam Fasting today  Tubular adenoma  Dec 2017  [place],  Assume 5 yr follow up.   Received flu vaccine Wants to get shingrx  The risk factors are reflected in the social history.  The roster of all physicians providing medical care to patient - is listed in the Snapshot section of the chart.  Activities of daily living:  The patient is 100% independent in all ADLs: dressing, toileting, feeding as well as independent mobility  Home safety : The patient has smoke detectors in the home. They wear seatbelts.  There are no firearms at home. There is no violence in the home.   There is no risks for hepatitis, STDs or HIV. There is no   history of blood transfusion. They have no travel history to infectious disease endemic areas of the world.  The patient has seen their dentist in the last six month. They have seen their eye doctor in the last year. They admit to slight hearing difficulty with regard to whispered voices and some television programs.  They have deferred audiologic testing in the last year.  They do not  have excessive sun exposure. Discussed the need for sun protection: hats, long sleeves and use of sunscreen if there is significant sun exposure.   Diet: the importance of a healthy diet is discussed. They do have a healthy diet.  The benefits of regular aerobic exercise were discussed. She walks 4 times per week ,  20 minutes.   Depression screen: there are no signs or vegative symptoms of depression- irritability, change in appetite, anhedonia, sadness/tearfullness.  Cognitive assessment: the patient manages all their financial and personal affairs and is actively engaged. They could relate day,date,year and events;  recalled 2/3 objects at 3 minutes; performed clock-face test normally.  The following portions of the patient's history were reviewed and updated as appropriate: allergies, current medications, past family history, past medical history,  past surgical history, past social history  and problem list.  Visual acuity was not assessed per patient preference since she has regular follow up with her ophthalmologist. Hearing and body mass index were assessed and reviewed.   During the course of the visit the patient was educated and counseled about appropriate screening and preventive services including : fall prevention , diabetes screening, nutrition counseling, colorectal cancer screening, and recommended immunizations.    CC: The primary encounter diagnosis was Alopecia. Diagnoses of Need for immunization against influenza, Fatigue, unspecified type, Vitamin D deficiency, Hyperlipidemia LDL goal <160, Primary insomnia, and Encounter for preventive health examination were also pertinent to this visit.  Cc: chronic insomnia:  Using [place] for years   Started with menopause.  Lorrin Mais does not help her sleep more than a few hours.  Sleeps 10  To 6:00 works until 4:00   History Janalyn has no past medical history on file.   She has a past surgical history that includes Bunionectomy.   Her family history includes Alcohol abuse in her sister; Congestive Heart Failure in her father and mother; Hypertension in her father, mother, and sister; Mental illness in her sister; Mental retardation in her father; Osteoporosis in her mother.She reports that she has never smoked. She has never used smokeless  tobacco. She reports that she drinks alcohol. She reports that she does not use drugs.  Outpatient Medications Prior to Visit  Medication Sig Dispense Refill  . calcium citrate-vitamin D (CITRACAL+D) 315-200 MG-UNIT per tablet Take 1 tablet by mouth 2 (two) times daily.    . Multiple Vitamin (MULTIVITAMIN) tablet  Take 1 tablet by mouth daily.    Marland Kitchen zolpidem (AMBIEN) 10 MG tablet Take 1 tablet (10 mg total) by mouth at bedtime as needed. 30 tablet 0  . estradiol (ESTRACE) 0.1 MG/GM vaginal cream Place 1 Applicatorful vaginally at bedtime.    . Estradiol (VAGIFEM) 10 MCG TABS vaginal tablet 1 tablet inserted vaginally every night for 2 weeks,  Then twice weekly therafter (Patient not taking: Reported on 04/24/2017) 22 tablet 0  . fluconazole (DIFLUCAN) 150 MG tablet Take 1 tablet (150 mg total) by mouth daily. (Patient not taking: Reported on 04/24/2017) 2 tablet 2  . phenazopyridine (PYRIDIUM) 100 MG tablet Take 1 tablet (100 mg total) by mouth 3 (three) times daily as needed for pain. (Patient not taking: Reported on 04/24/2017) 8 tablet 0   No facility-administered medications prior to visit.     Review of Systems   Patient denies headache, fevers, malaise, unintentional weight loss, skin rash, eye pain, sinus congestion and sinus pain, sore throat, dysphagia,  hemoptysis , cough, dyspnea, wheezing, chest pain, palpitations, orthopnea, edema, abdominal pain, nausea, melena, diarrhea, constipation, flank pain, dysuria, hematuria, urinary  Frequency, nocturia, numbness, tingling, seizures,  Focal weakness, Loss of consciousness,  Tremor,  depression, anxiety, and suicidal ideation.      Objective:  BP 108/74 (BP Location: Left Arm, Patient Position: Sitting, Cuff Size: Normal)   Pulse 76   Temp 97.8 F (36.6 C) (Oral)   Resp 15   Ht 5\' 5"  (1.651 m)   Wt 138 lb 12.8 oz (63 kg)   SpO2 99%   BMI 23.10 kg/m   Physical Exam   General appearance: alert, cooperative and appears stated age Ears: normal TM's and external ear canals both ears Throat: lips, mucosa, and tongue normal; teeth and gums normal Neck: no adenopathy, no carotid bruit, supple, symmetrical, trachea midline and thyroid not enlarged, symmetric, no tenderness/mass/nodules Back: symmetric, no curvature. ROM normal. No CVA  tenderness. Lungs: clear to auscultation bilaterally Heart: regular rate and rhythm, S1, S2 normal, no murmur, click, rub or gallop Abdomen: soft, non-tender; bowel sounds normal; no masses,  no organomegaly Pulses: 2+ and symmetric Skin: Skin color, texture, turgor normal. No rashes or lesions Lymph nodes: Cervical, supraclavicular, and axillary nodes normal.    Assessment & Plan:   Problem List Items Addressed This Visit    RESOLVED: Alopecia - Primary   Relevant Orders   Comprehensive metabolic panel (Completed)   TSH (Completed)   CBC with Differential/Platelet (Completed)   Encounter for preventive health examination    Annual comprehensive preventive exam was done as well as an evaluation and management of chronic conditions .  During the course of the visit the patient was educated and counseled about appropriate screening and preventive services including :  diabetes screening, lipid analysis with projected  10 year  risk for CAD , nutrition counseling, breast, cervical and colorectal cancer screening, and recommended immunizations.  Printed recommendations for health maintenance screenings was given      Insomnia    Discussed natural remedies for insomnia including herbal tea and melatonin.  Reviewd principles of good sleep hygiene.  Trial of lunesta offered  Other Visit Diagnoses    Need for immunization against influenza       Relevant Orders   Flu Vaccine QUAD 36+ mos IM (Completed)   Fatigue, unspecified type       Vitamin D deficiency       Relevant Orders   VITAMIN D 25 Hydroxy (Vit-D Deficiency, Fractures) (Completed)   Hyperlipidemia LDL goal <160       Relevant Orders   Lipid panel (Completed)      I have discontinued Ms. Nam's Estradiol, estradiol, and phenazopyridine. I am also having her start on Eszopiclone. Additionally, I am having her maintain her multivitamin, calcium citrate-vitamin D, zolpidem, and fluconazole.  Meds ordered this  encounter  Medications  . Eszopiclone (ESZOPICLONE) 3 MG TABS    Sig: Take 1 tablet (3 mg total) by mouth at bedtime. Take immediately before bedtime    Dispense:  30 tablet    Refill:  4  . fluconazole (DIFLUCAN) 150 MG tablet    Sig: Take 1 tablet (150 mg total) by mouth daily.    Dispense:  2 tablet    Refill:  2    Keep on file for future refills    Medications Discontinued During This Encounter  Medication Reason  . estradiol (ESTRACE) 0.1 MG/GM vaginal cream Patient has not taken in last 30 days  . Estradiol (VAGIFEM) 10 MCG TABS vaginal tablet Patient has not taken in last 30 days  . phenazopyridine (PYRIDIUM) 100 MG tablet Patient has not taken in last 30 days  . fluconazole (DIFLUCAN) 150 MG tablet Reorder    Follow-up: No Follow-up on file.   Crecencio Mc, MD

## 2017-04-24 NOTE — Progress Notes (Signed)
Subjective:   Kimberly Gill is a 66 y.o. female who presents for Medicare Annual (Subsequent) preventive examination.  Review of Systems:  No ROS.  Medicare Wellness Visit. Additional risk factors are reflected in the social history. Cardiac Risk Factors include: advanced age (>40men, >51 women)     Objective:     Vitals: BP 108/74 (BP Location: Left Arm, Patient Position: Sitting, Cuff Size: Normal)   Pulse 76   Temp 97.8 F (36.6 C) (Oral)   Resp 15   Ht 5\' 5"  (1.651 m)   Wt 138 lb (62.6 kg)   SpO2 99%   BMI 22.96 kg/m   Body mass index is 22.96 kg/m.   Tobacco History  Smoking Status  . Never Smoker  Smokeless Tobacco  . Never Used     Counseling given: Not Answered   History reviewed. No pertinent past medical history. Past Surgical History:  Procedure Laterality Date  . BUNIONECTOMY     Family History  Problem Relation Age of Onset  . Hypertension Mother   . Osteoporosis Mother   . Congestive Heart Failure Mother   . Hypertension Father   . Mental retardation Father        dementia  . Congestive Heart Failure Father   . Alcohol abuse Sister   . Mental illness Sister   . Hypertension Sister    History  Sexual Activity  . Sexual activity: Yes    Outpatient Encounter Prescriptions as of 04/24/2017  Medication Sig  . calcium citrate-vitamin D (CITRACAL+D) 315-200 MG-UNIT per tablet Take 1 tablet by mouth 2 (two) times daily.  . Eszopiclone (ESZOPICLONE) 3 MG TABS Take 1 tablet (3 mg total) by mouth at bedtime. Take immediately before bedtime  . fluconazole (DIFLUCAN) 150 MG tablet Take 1 tablet (150 mg total) by mouth daily.  . Multiple Vitamin (MULTIVITAMIN) tablet Take 1 tablet by mouth daily.  Marland Kitchen zolpidem (AMBIEN) 10 MG tablet Take 1 tablet (10 mg total) by mouth at bedtime as needed.   No facility-administered encounter medications on file as of 04/24/2017.     Activities of Daily Living In your present state of health, do you have any  difficulty performing the following activities: 04/24/2017  Hearing? N  Vision? N  Difficulty concentrating or making decisions? N  Walking or climbing stairs? N  Dressing or bathing? N  Doing errands, shopping? N  Preparing Food and eating ? N  Using the Toilet? N  In the past six months, have you accidently leaked urine? N  Do you have problems with loss of bowel control? N  Managing your Medications? N  Managing your Finances? N  Housekeeping or managing your Housekeeping? N  Some recent data might be hidden    Patient Care Team: Crecencio Mc, MD as PCP - General (Internal Medicine)    Assessment:    This is a routine wellness examination for Kimberly Gill. The goal of the wellness visit is to assist the patient how to close the gaps in care and create a preventative care plan for the patient.   The roster of all physicians providing medical care to patient is listed in the Snapshot section of the chart.  Taking calcium VIT D as appropriate/Osteoporosis risk reviewed.    Safety issues reviewed; Smoke and carbon monoxide detectors in the home. No firearms in the home.  Wears seatbelts when driving or riding with others. Patient does wear sunscreen or protective clothing when in direct sunlight. No violence in the home.  Patient is alert, normal appearance, oriented to person/place/and time.  Correctly identified the president of the [place], recall of 3/3 words, and performing simple calculations. Displays appropriate judgement and can read correct time from watch face.   No new identified risk were noted.  No failures at ADL's or IADL's.    BMI- discussed the importance of a healthy diet, water intake and the benefits of aerobic exercise. Educational material provided.   24 hour diet recall: Low carb foods  Daily fluid intake: 1 cups of caffeine, 2-3 cups of water  Dental- every 4 months.  Eye- Visual acuity not assessed per patient preference since they have regular follow  up with the ophthalmologist.  Wears corrective lenses.  Sleep patterns- Sleeps 4 hours at night.  Restless. Taking ambien as directed.  She does not nap during the day.  Followed by PCP.  Mammogram scheduled 04/2017.  Patient Concerns: None at this time. Follow up with PCP as needed.  Exercise Activities and Dietary recommendations Current Exercise Habits: Home exercise routine, Type of exercise: walking;yoga, Time (Minutes): 60, Frequency (Times/Week): 5, Weekly Exercise (Minutes/Week): 300, Intensity: Moderate  Goals    . Increase water intake      Fall Risk Fall Risk  04/24/2017 11/13/2016 04/19/2016 04/20/2015  Falls in the past year? No No No No   Depression Screen PHQ 2/9 Scores 04/24/2017 11/13/2016 04/19/2016 04/20/2015  PHQ - 2 Score 0 0 0 0  PHQ- 9 Score 0 - - -     Cognitive Function MMSE - Mini Mental State Exam 04/24/2017  Orientation to time 5  Orientation to Place 5  Registration 3  Attention/ Calculation 5  Recall 3  Language- name 2 objects 2  Language- repeat 1  Language- follow 3 step command 3  Language- read & follow direction 1  Write a sentence 1  Copy design 1  Total score 30        Immunization History  Administered Date(s) Administered  . Influenza, High Dose Seasonal PF 04/19/2016  . Influenza,inj,Quad PF,6+ Mos 04/16/2014, 04/20/2015, 04/24/2017  . Pneumococcal Conjugate-13 04/20/2015  . Pneumococcal Polysaccharide-23 11/13/2016  . Tdap 03/14/2012  . Zoster 02/13/2012   Screening Tests Health Maintenance  Topic Date Due  . MAMMOGRAM  05/05/2015  . TETANUS/TDAP  03/14/2022  . COLONOSCOPY  07/24/2026  . INFLUENZA VACCINE  Completed  . DEXA SCAN  Completed  . Hepatitis C Screening  Completed  . PNA vac Low Risk Adult  Completed      Plan:    End of life planning; Advance aging; Advanced directives discussed. Copy of current HCPOA/Living Will requested.    I have personally reviewed and noted the following in the patient's chart:    . Medical and social history . Use of alcohol, tobacco or illicit drugs  . Current medications and supplements . Functional ability and status . Nutritional status . Physical activity . Advanced directives . List of other physicians . Hospitalizations, surgeries, and ER visits in previous 12 months . Vitals . Screenings to include cognitive, depression, and falls . Referrals and appointments  In addition, I have reviewed and discussed with patient certain preventive protocols, quality metrics, and best practice recommendations. A written personalized care plan for preventive services as well as general preventive health recommendations were provided to patient.     OBrien-Blaney, Victorious Kundinger L, LPN  11/19/3242   I have reviewed the above information and agree with above.   Deborra Medina, MD

## 2017-04-24 NOTE — Patient Instructions (Addendum)
  Kimberly Gill , Thank you for taking time to come for your Medicare Wellness Visit. I appreciate your ongoing commitment to your health goals. Please review the following plan we discussed and let me know if I can assist you in the future.   Follow up with Dr. Derrel Nip as needed.    Bring a copy of your [place] and/or Living Will to be scanned into chart.  Have a great day!  These are the goals we discussed: Goals    . Increase water intake       This is a list of the screening recommended for you and due dates:  Health Maintenance  Topic Date Due  . Mammogram  05/05/2015  . Tetanus Vaccine  03/14/2022  . Colon Cancer Screening  07/24/2026  . Flu Shot  Completed  . DEXA scan (bone density measurement)  Completed  .  Hepatitis C: One time screening is recommended by Center for Disease Control  (CDC) for  adults born from [birth year] through 1966.   Completed  . Pneumonia vaccines  Completed

## 2017-04-25 ENCOUNTER — Encounter: Payer: Self-pay | Admitting: Internal Medicine

## 2017-04-25 DIAGNOSIS — G4700 Insomnia, unspecified: Secondary | ICD-10-CM | POA: Insufficient documentation

## 2017-04-25 NOTE — Assessment & Plan Note (Signed)
Discussed natural remedies for insomnia including herbal tea and melatonin.  Reviewd principles of good sleep hygiene.  Trial of lunesta offered

## 2017-04-25 NOTE — Assessment & Plan Note (Addendum)
Annual comprehensive preventive exam was done as well as an evaluation and management of chronic conditions .  During the course of the visit the patient was educated and counseled about appropriate screening and preventive services including :  diabetes screening, lipid analysis with projected  10 year  risk for CAD , nutrition counseling, breast, cervical and colorectal cancer screening, and recommended immunizations.  Printed recommendations for health maintenance screenings was given 

## 2017-05-29 ENCOUNTER — Other Ambulatory Visit: Payer: Self-pay | Admitting: Internal Medicine

## 2017-05-30 ENCOUNTER — Telehealth: Payer: Self-pay | Admitting: *Deleted

## 2017-05-30 NOTE — Telephone Encounter (Signed)
Pt has requested to have a script in the place the lunesta, pt stated that her insurance will only cover [place] [address] [facility]

## 2017-05-30 NOTE — Telephone Encounter (Signed)
Needs OV for March

## 2017-05-30 NOTE — Telephone Encounter (Signed)
Please advise, thanks.

## 2017-05-30 NOTE — Telephone Encounter (Signed)
Last refill was 8/22 for #30 no refills, last OV Was 9/5

## 2017-05-31 NOTE — Telephone Encounter (Signed)
Can you confirm that the ambein was sent, since when I tried to order it Epic says it was already done

## 2017-05-31 NOTE — Telephone Encounter (Signed)
Spoke with [facility], gave RX over the phone as they didn't receive it.

## 2017-07-02 DIAGNOSIS — N958 Other specified menopausal and perimenopausal disorders: Secondary | ICD-10-CM | POA: Diagnosis not present

## 2017-07-02 DIAGNOSIS — Z6823 Body mass index (BMI) 23.0-23.9, adult: Secondary | ICD-10-CM | POA: Diagnosis not present

## 2017-07-02 DIAGNOSIS — Z1231 Encounter for screening mammogram for malignant neoplasm of breast: Secondary | ICD-10-CM | POA: Diagnosis not present

## 2017-07-02 DIAGNOSIS — Z01419 Encounter for gynecological examination (general) (routine) without abnormal findings: Secondary | ICD-10-CM | POA: Diagnosis not present

## 2017-07-02 DIAGNOSIS — M8588 Other specified disorders of bone density and structure, other site: Secondary | ICD-10-CM | POA: Diagnosis not present

## 2017-11-27 ENCOUNTER — Other Ambulatory Visit: Payer: Self-pay | Admitting: Internal Medicine

## 2017-11-27 ENCOUNTER — Ambulatory Visit: Payer: Self-pay | Admitting: *Deleted

## 2017-11-27 MED ORDER — ZOLPIDEM TARTRATE 10 MG PO TABS: 10.0000 mg | ORAL_TABLET | Freq: Every evening | ORAL | 4 refills | Status: DC | PRN

## 2017-11-27 NOTE — Telephone Encounter (Signed)
Copied from [place] [phone]. Topic: Quick Communication - See Telephone Encounter >> Nov 27, 2017  8:29 AM Vernona Rieger wrote: CRM for notification. See Telephone encounter for: 11/27/17.  zolpidem (AMBIEN) 10 MG tablet  [id number] - [address], [place]

## 2017-11-27 NOTE — Telephone Encounter (Signed)
Opened in error

## 2017-11-27 NOTE — Telephone Encounter (Signed)
Refill zolpidem 10 MG tab LOV 04/24/17 PCP Dr. Derrel Nip Last prescribed 05/30/17 Pharmacy on file

## 2017-12-09 ENCOUNTER — Telehealth: Payer: Self-pay | Admitting: Internal Medicine

## 2017-12-09 DIAGNOSIS — E039 Hypothyroidism, unspecified: Secondary | ICD-10-CM

## 2017-12-09 NOTE — Telephone Encounter (Signed)
Free T4 added

## 2017-12-09 NOTE — Telephone Encounter (Signed)
Please advise 

## 2017-12-09 NOTE — Telephone Encounter (Signed)
Copied from [place] [phone]. Topic: Quick Communication - See Telephone Encounter >> Dec 09, 2017  1:12 PM Ether Griffins B wrote: CRM for notification. See Telephone encounter for: 12/09/17. Pt at her physical appt was told her thyroid is underactive and she is wondering if she could get her thyroid rechecked.

## 2017-12-09 NOTE — Telephone Encounter (Signed)
TSH has been ordered is there anything else that needs to be ordered?

## 2017-12-10 NOTE — Telephone Encounter (Signed)
Spoke with pt and scheduled her a lab appt. Pt is aware of appt date and time.  

## 2017-12-13 ENCOUNTER — Other Ambulatory Visit (INDEPENDENT_AMBULATORY_CARE_PROVIDER_SITE_OTHER): Payer: Medicare Other

## 2017-12-13 DIAGNOSIS — E039 Hypothyroidism, unspecified: Secondary | ICD-10-CM | POA: Diagnosis not present

## 2017-12-13 LAB — TSH: TSH: 3.8 u[IU]/mL (ref 0.35–4.50)

## 2017-12-13 LAB — T4, FREE: Free T4: 0.67 ng/dL (ref 0.60–1.60)

## 2017-12-30 ENCOUNTER — Other Ambulatory Visit: Payer: Self-pay

## 2017-12-30 ENCOUNTER — Encounter: Payer: Self-pay | Admitting: Family Medicine

## 2017-12-30 ENCOUNTER — Ambulatory Visit (INDEPENDENT_AMBULATORY_CARE_PROVIDER_SITE_OTHER): Payer: Medicare Other | Admitting: Family Medicine

## 2017-12-30 VITALS — BP 130/80 | HR 62 | Temp 97.6°F | Ht 65.0 in | Wt 141.5 lb

## 2017-12-30 DIAGNOSIS — N3000 Acute cystitis without hematuria: Secondary | ICD-10-CM

## 2017-12-30 DIAGNOSIS — R350 Frequency of micturition: Secondary | ICD-10-CM | POA: Diagnosis not present

## 2017-12-30 LAB — POC URINALSYSI DIPSTICK (AUTOMATED)
Bilirubin, UA: NEGATIVE
GLUCOSE UA: NEGATIVE
KETONES UA: NEGATIVE
Leukocytes, UA: NEGATIVE
Nitrite, UA: NEGATIVE
Protein, UA: NEGATIVE
RBC UA: NEGATIVE
SPEC GRAV UA: 1.015 (ref 1.010–1.025)
UROBILINOGEN UA: 0.2 U/dL
pH, UA: 6.5 (ref 5.0–8.0)

## 2017-12-30 MED ORDER — NITROFURANTOIN MONOHYD MACRO 100 MG PO CAPS: 100.0000 mg | ORAL_CAPSULE | Freq: Two times a day (BID) | ORAL | 0 refills | Status: AC

## 2017-12-30 NOTE — Progress Notes (Signed)
Dr. Frederico Hamman T. Isel Skufca, MD, [facility] [place], [address] Phone: [phone] Fax: [phone]  12/30/2017  Patient: Kimberly Gill, MRN: [id number], DOB: May 04, 1951, 67 y.o.  Primary Physician:  Crecencio Mc, MD   Chief Complaint  Patient presents with  . Urinary Frequency  . Fatigue  . Dysuria   Subjective:   This 67 y.o. female patient presents with burning, urgency. No vaginal discharge or external irritation.  No STD exposure. No abd pain, no flank pain.  Burning and urination. Felt a little tired. Urgency.  AZO tabs.   The PMH, PSH, Social History, Family History, Medications, and allergies have been reviewed in [facility], and have been updated if relevant.  Patient Active Problem List   Diagnosis Date Noted  . Insomnia 04/25/2017  . Postmenopausal atrophic vaginitis 04/14/2013  . Medicare annual wellness visit, subsequent 04/07/2012  . Encounter for preventive health examination 04/07/2012  . GERD (gastroesophageal reflux disease) 12/09/2011    History reviewed. No pertinent past medical history.  Past Surgical History:  Procedure Laterality Date  . BUNIONECTOMY      Social History   Socioeconomic History  . Marital status: Married    Spouse name: Not on file  . Number of children: Not on file  . Years of education: Not on file  . Highest education level: Not on file  Occupational History  . Not on file  Social Needs  . Financial resource strain: Not on file  . Food insecurity:    Worry: Not on file    Inability: Not on file  . Transportation needs:    Medical: Not on file    Non-medical: Not on file  Tobacco Use  . Smoking status: Never Smoker  . Smokeless tobacco: Never Used  Substance and Sexual Activity  . Alcohol use: Yes  . Drug use: No  . Sexual activity: Yes  Lifestyle  . Physical activity:    Days per week: Not on file    Minutes per session: Not on file  . Stress:  Not on file  Relationships  . Social connections:    Talks on phone: Not on file    Gets together: Not on file    Attends religious service: Not on file    Active member of club or organization: Not on file    Attends meetings of clubs or organizations: Not on file    Relationship status: Not on file  . Intimate partner violence:    Fear of current or ex partner: Not on file    Emotionally abused: Not on file    Physically abused: Not on file    Forced sexual activity: Not on file  Other Topics Concern  . Not on file  Social History Narrative  . Not on file    Family History  Problem Relation Age of Onset  . Hypertension Mother   . Osteoporosis Mother   . Congestive Heart Failure Mother   . Hypertension Father   . Mental retardation Father        dementia  . Congestive Heart Failure Father   . Alcohol abuse Sister   . Mental illness Sister   . Hypertension Sister     Allergies  Allergen Reactions  . Sulfa Antibiotics Rash  . Penicillins   . Sulfa Drugs Cross Reactors     Medication list reviewed and updated in full in [place].  GEN:  no fevers, chills.  GI: No n/v/d, eating normally Otherwise, ROS is as per the HPI.  Objective:   Blood pressure 130/80, pulse 62, temperature 97.6 F (36.4 C), temperature source Oral, height 5\' 5"  (1.651 m), weight 141 lb 8 oz (64.2 kg).  GEN: WDWN, A&Ox4,NAD. Non-toxic HEENT: Atraumatc, normocephalic. CV: RRR, No M/G/R PULM: CTA B, No wheezes, crackles, or rhonchi ABD: S, NT, ND, +BS, no rebound. No CVAT. No suprapubic tenderness. EXT: No c/c/e  Objective Data: Results for orders placed or performed in visit on 12/30/17  POCT Urinalysis Dipstick (Automated)  Result Value Ref Range   Color, UA yellow    Clarity, UA clear    Glucose, UA negative    Bilirubin, UA negative    Ketones, UA negative    Spec Grav, UA 1.015 1.010 - 1.025   Blood, UA negative    pH, UA 6.5 5.0 - 8.0   Protein, UA negative     Urobilinogen, UA 0.2 0.2 or 1.0 E.U./dL   Nitrite, UA negative    Leukocytes, UA Negative Negative    Assessment and Plan:   Acute cystitis without hematuria  Urinary frequency - Plan: POCT Urinalysis Dipstick (Automated), Urine Culture  Rx with ABX as below. Drink plenty of fluids and supportive care.  Follow-up: No follow-ups on file.  Meds ordered this encounter  Medications  . nitrofurantoin, macrocrystal-monohydrate, (MACROBID) 100 MG capsule    Sig: Take 1 capsule (100 mg total) by mouth 2 (two) times daily for 7 days.    Dispense:  14 capsule    Refill:  0   Orders Placed This Encounter  Procedures  . Urine Culture  . POCT Urinalysis Dipstick (Automated)    Signed,  Daviana Haymaker T. Rupinder Livingston, MD   Patient's Medications  New Prescriptions   NITROFURANTOIN, MACROCRYSTAL-MONOHYDRATE, (MACROBID) 100 MG CAPSULE    Take 1 capsule (100 mg total) by mouth 2 (two) times daily for 7 days.  Previous Medications   CALCIUM CITRATE-VITAMIN D (CITRACAL+D) 315-200 MG-UNIT PER TABLET    Take 1 tablet by mouth 2 (two) times daily.   MULTIPLE VITAMIN (MULTIVITAMIN) TABLET    Take 1 tablet by mouth daily.   ZOLPIDEM (AMBIEN) 10 MG TABLET    Take 1 tablet (10 mg total) by mouth at bedtime as needed.  Modified Medications   No medications on file  Discontinued Medications   FLUCONAZOLE (DIFLUCAN) 150 MG TABLET    Take 1 tablet (150 mg total) by mouth daily.

## 2017-12-31 LAB — URINE CULTURE
MICRO NUMBER:: 90579989
SPECIMEN QUALITY:: ADEQUATE

## 2018-04-23 DIAGNOSIS — L309 Dermatitis, unspecified: Secondary | ICD-10-CM | POA: Diagnosis not present

## 2018-04-25 ENCOUNTER — Ambulatory Visit: Payer: Medicare Other

## 2018-05-20 ENCOUNTER — Encounter: Payer: Medicare Other | Admitting: Internal Medicine

## 2018-05-21 ENCOUNTER — Ambulatory Visit: Payer: Medicare Other

## 2018-05-30 ENCOUNTER — Ambulatory Visit (INDEPENDENT_AMBULATORY_CARE_PROVIDER_SITE_OTHER): Payer: Medicare Other | Admitting: Internal Medicine

## 2018-05-30 ENCOUNTER — Encounter: Payer: Self-pay | Admitting: Internal Medicine

## 2018-05-30 VITALS — BP 122/62 | HR 70 | Temp 97.9°F | Resp 15 | Ht 65.0 in | Wt 144.6 lb

## 2018-05-30 DIAGNOSIS — Z0000 Encounter for general adult medical examination without abnormal findings: Secondary | ICD-10-CM | POA: Diagnosis not present

## 2018-05-30 DIAGNOSIS — R5383 Other fatigue: Secondary | ICD-10-CM | POA: Diagnosis not present

## 2018-05-30 DIAGNOSIS — Z1239 Encounter for other screening for malignant neoplasm of breast: Secondary | ICD-10-CM

## 2018-05-30 DIAGNOSIS — N952 Postmenopausal atrophic vaginitis: Secondary | ICD-10-CM | POA: Diagnosis not present

## 2018-05-30 DIAGNOSIS — Z23 Encounter for immunization: Secondary | ICD-10-CM

## 2018-05-30 LAB — COMPREHENSIVE METABOLIC PANEL
ALBUMIN: 4.2 g/dL (ref 3.5–5.2)
ALT: 14 U/L (ref 0–35)
AST: 19 U/L (ref 0–37)
Alkaline Phosphatase: 46 U/L (ref 39–117)
BUN: 14 mg/dL (ref 6–23)
CHLORIDE: 102 meq/L (ref 96–112)
CO2: 30 mEq/L (ref 19–32)
Calcium: 9.4 mg/dL (ref 8.4–10.5)
Creatinine, Ser: 0.78 mg/dL (ref 0.40–1.20)
GFR: 77.99 mL/min (ref >60.00)
Glucose, Bld: 109 mg/dL — ABNORMAL HIGH (ref 70–99)
POTASSIUM: 3.5 meq/L (ref 3.5–5.1)
Sodium: 137 mEq/L (ref 135–145)
Total Bilirubin: 0.5 mg/dL (ref 0.2–1.2)
Total Protein: 7.2 g/dL (ref 6.0–8.3)

## 2018-05-30 LAB — TSH: TSH: 3.18 u[IU]/mL (ref 0.35–4.50)

## 2018-05-30 MED ORDER — FLUCONAZOLE 150 MG PO TABS: 150.0000 mg | ORAL_TABLET | Freq: Every day | ORAL | 2 refills | Status: DC

## 2018-05-30 MED ORDER — ZOLPIDEM TARTRATE 10 MG PO TABS: 10.0000 mg | ORAL_TABLET | Freq: Every evening | ORAL | 4 refills | Status: DC | PRN

## 2018-05-30 MED ORDER — ZOSTER VAC RECOMB ADJUVANTED 50 MCG/0.5ML IM SUSR: 0.5000 mL | Freq: Once | INTRAMUSCULAR | 1 refills | Status: AC

## 2018-05-30 NOTE — Patient Instructions (Signed)
Good to see you!!  Your annual mammogram has been ordered.  You are encouraged (required) to call to make your appointment at [facility]   The ShingRx vaccine is now available in local pharmacies and is much more protective thant Zostavaxs,  It is therefore ADVISED for all interested adults over 50 to prevent shingles    Health Maintenance for Postmenopausal Women Menopause is a normal process in which your reproductive ability comes to an end. This process happens gradually over a span of months to years, usually between the ages of 46 and 27. Menopause is complete when you have missed 12 consecutive menstrual periods. It is important to talk with your health care provider about some of the most common conditions that affect postmenopausal women, such as heart disease, cancer, and bone loss (osteoporosis). Adopting a healthy lifestyle and getting preventive care can help to promote your health and wellness. Those actions can also lower your chances of developing some of these common conditions. What should I know about menopause? During menopause, you may experience a number of symptoms, such as:  Moderate-to-severe hot flashes.  Night sweats.  Decrease in sex drive.  Mood swings.  Headaches.  Tiredness.  Irritability.  Memory problems.  Insomnia.  Choosing to treat or not to treat menopausal changes is an individual decision that you make with your health care provider. What should I know about hormone replacement therapy and supplements? Hormone therapy products are effective for treating symptoms that are associated with menopause, such as hot flashes and night sweats. Hormone replacement carries certain risks, especially as you become older. If you are thinking about using estrogen or estrogen with progestin treatments, discuss the benefits and risks with your health care provider. What should I know about heart disease and stroke? Heart disease, heart attack, and  stroke become more likely as you age. This may be due, in part, to the hormonal changes that your body experiences during menopause. These can affect how your body processes dietary fats, triglycerides, and cholesterol. Heart attack and stroke are both medical emergencies. There are many things that you can do to help prevent heart disease and stroke:  Have your blood pressure checked at least every 1-2 years. High blood pressure causes heart disease and increases the risk of stroke.  If you are 11-76 years old, ask your health care provider if you should take aspirin to prevent a heart attack or a stroke.  Do not use any tobacco products, including cigarettes, chewing tobacco, or electronic cigarettes. If you need help quitting, ask your health care provider.  It is important to eat a healthy diet and maintain a healthy weight. ? Be sure to include plenty of vegetables, fruits, low-fat dairy products, and lean protein. ? Avoid eating foods that are high in solid fats, added sugars, or salt (sodium).  Get regular exercise. This is one of the most important things that you can do for your health. ? Try to exercise for at least 150 minutes each week. The type of exercise that you do should increase your heart rate and make you sweat. This is known as moderate-intensity exercise. ? Try to do strengthening exercises at least twice each week. Do these in addition to the moderate-intensity exercise.  Know your numbers.Ask your health care provider to check your cholesterol and your blood glucose. Continue to have your blood tested as directed by your health care provider.  What should I know about cancer screening? There are several types of  cancer. Take the following steps to reduce your risk and to catch any cancer development as early as possible. Breast Cancer  Practice breast self-awareness. ? This means understanding how your breasts normally appear and feel. ? It also means doing regular  breast self-exams. Let your health care provider know about any changes, no matter how small.  If you are 35 or older, have a clinician do a breast exam (clinical breast exam or CBE) every year. Depending on your age, family history, and medical history, it may be recommended that you also have a yearly breast X-ray (mammogram).  If you have a family history of breast cancer, talk with your health care provider about genetic screening.  If you are at high risk for breast cancer, talk with your health care provider about having an MRI and a mammogram every year.  Breast cancer (BRCA) gene test is recommended for women who have family members with BRCA-related cancers. Results of the assessment will determine the need for genetic counseling and BRCA1 and for BRCA2 testing. BRCA-related cancers include these types: ? Breast. This occurs in males or females. ? Ovarian. ? Tubal. This may also be called fallopian tube cancer. ? Cancer of the abdominal or pelvic lining (peritoneal cancer). ? Prostate. ? Pancreatic.  Cervical, Uterine, and Ovarian Cancer Your health care provider may recommend that you be screened regularly for cancer of the pelvic organs. These include your ovaries, uterus, and vagina. This screening involves a pelvic exam, which includes checking for microscopic changes to the surface of your cervix (Pap test).  For women ages 21-65, health care providers may recommend a pelvic exam and a Pap test every three years. For women ages 46-65, they may recommend the Pap test and pelvic exam, combined with testing for human papilloma virus (HPV), every five years. Some types of HPV increase your risk of cervical cancer. Testing for HPV may also be done on women of any age who have unclear Pap test results.  Other health care providers may not recommend any screening for nonpregnant women who are considered low risk for pelvic cancer and have no symptoms. Ask your health care provider if a  screening pelvic exam is right for you.  If you have had past treatment for cervical cancer or a condition that could lead to cancer, you need Pap tests and screening for cancer for at least 20 years after your treatment. If Pap tests have been discontinued for you, your risk factors (such as having a new sexual partner) need to be reassessed to determine if you should start having screenings again. Some women have medical problems that increase the chance of getting cervical cancer. In these cases, your health care provider may recommend that you have screening and Pap tests more often.  If you have a family history of uterine cancer or ovarian cancer, talk with your health care provider about genetic screening.  If you have vaginal bleeding after reaching menopause, tell your health care provider.  There are currently no reliable tests available to screen for ovarian cancer.  Lung Cancer Lung cancer screening is recommended for adults 38-66 years old who are at high risk for lung cancer because of a history of smoking. A yearly low-dose CT scan of the lungs is recommended if you:  Currently smoke.  Have a history of at least 30 pack-years of smoking and you currently smoke or have quit within the past 15 years. A pack-year is smoking an average of one pack of  cigarettes per day for one year.  Yearly screening should:  Continue until it has been 15 years since you quit.  Stop if you develop a health problem that would prevent you from having lung cancer treatment.  Colorectal Cancer  This type of cancer can be detected and can often be prevented.  Routine colorectal cancer screening usually begins at age 69 and continues through age 12.  If you have risk factors for colon cancer, your health care provider may recommend that you be screened at an earlier age.  If you have a family history of colorectal cancer, talk with your health care provider about genetic screening.  Your health  care provider may also recommend using home test kits to check for hidden blood in your stool.  A small camera at the end of a tube can be used to examine your colon directly (sigmoidoscopy or colonoscopy). This is done to check for the earliest forms of colorectal cancer.  Direct examination of the colon should be repeated every 5-10 years until age 3. However, if early forms of precancerous polyps or small growths are found or if you have a family history or genetic risk for colorectal cancer, you may need to be screened more often.  Skin Cancer  Check your skin from head to toe regularly.  Monitor any moles. Be sure to tell your health care provider: ? About any new moles or changes in moles, especially if there is a change in a mole's shape or color. ? If you have a mole that is larger than the size of a pencil eraser.  If any of your family members has a history of skin cancer, especially at a young age, talk with your health care provider about genetic screening.  Always use sunscreen. Apply sunscreen liberally and repeatedly throughout the day.  Whenever you are outside, protect yourself by wearing long sleeves, pants, a wide-brimmed hat, and sunglasses.  What should I know about osteoporosis? Osteoporosis is a condition in which bone destruction happens more quickly than new bone creation. After menopause, you may be at an increased risk for osteoporosis. To help prevent osteoporosis or the bone fractures that can happen because of osteoporosis, the following is recommended:  If you are 78-39 years old, get at least 1,000 mg of calcium and at least 600 mg of vitamin D per day.  If you are older than age 28 but younger than age 38, get at least 1,200 mg of calcium and at least 600 mg of vitamin D per day.  If you are older than age 93, get at least 1,200 mg of calcium and at least 800 mg of vitamin D per day.  Smoking and excessive alcohol intake increase the risk of  osteoporosis. Eat foods that are rich in calcium and vitamin D, and do weight-bearing exercises several times each week as directed by your health care provider. What should I know about how menopause affects my mental health? Depression may occur at any age, but it is more common as you become older. Common symptoms of depression include:  Low or sad mood.  Changes in sleep patterns.  Changes in appetite or eating patterns.  Feeling an overall lack of motivation or enjoyment of activities that you previously enjoyed.  Frequent crying spells.  Talk with your health care provider if you think that you are experiencing depression. What should I know about immunizations? It is important that you get and maintain your immunizations. These include:  Tetanus, diphtheria,  and pertussis (Tdap) booster vaccine.  Influenza every year before the flu season begins.  Pneumonia vaccine.  Shingles vaccine.  Your health care provider may also recommend other immunizations. This information is not intended to replace advice given to you by your health care provider. Make sure you discuss any questions you have with your health care provider. Document Released: 09/28/2005 Document Revised: 02/24/2016 Document Reviewed: 05/10/2015 Elsevier Interactive Patient Education  2018 [employer].

## 2018-05-30 NOTE — Progress Notes (Signed)
Patient ID: Kimberly Gill, female    DOB: [birth year]  Age: 67 y.o. MRN: [id number]  The patient is here for  Follow up and management of other chronic and acute problems.  Colon 2017 Mammogram 2018  [facility] normal last year 3d  History of breast biopsy by [facility] remotely    The risk factors are reflected in the social history.  The roster of all physicians providing medical care to patient - is listed in the Snapshot section of the chart.  Activities of daily living:  The patient is 100% independent in all ADLs: dressing, toileting, feeding as well as independent mobility  Home safety : The patient has smoke detectors in the home. They wear seatbelts.  There are no firearms at home. There is no violence in the home.   There is no risks for hepatitis, STDs or HIV. There is no   history of blood transfusion. They have no travel history to infectious disease endemic areas of the world.  The patient has seen their dentist in the last six month. They have seen their eye doctor in the last year. They admit to slight hearing difficulty with regard to whispered voices and some television programs.  They have deferred audiologic testing in the last year.  They do not  have excessive sun exposure. Discussed the need for sun protection: hats, long sleeves and use of sunscreen if there is significant sun exposure.   Diet: the importance of a healthy diet is discussed. They do have a healthy diet.  The benefits of regular aerobic exercise were discussed. She exercises using yoga 3 times per week .   Depression screen: there are no signs or vegative symptoms of depression- irritability, change in appetite, anhedonia, sadness/tearfullness.  Cognitive assessment: the patient manages all their financial and personal affairs and is actively engaged. They could relate day,date,year and events; recalled 2/3 objects at 3 minutes; performed clock-face test normally.  The following portions  of the patient's history were reviewed and updated as appropriate: allergies, current medications, past family history, past medical history,  past surgical history, past social history  and problem list.  Visual acuity was not assessed per patient preference since she has regular follow up with her ophthalmologist. Hearing and body mass index were assessed and reviewed.   During the course of the visit the patient was educated and counseled about appropriate screening and preventive services including : fall prevention , diabetes screening, nutrition counseling, colorectal cancer screening, and recommended immunizations.    CC: The primary encounter diagnosis was Fatigue, unspecified type. Diagnoses of Breast cancer screening, Encounter for immunization, Encounter for preventive health examination, and Postmenopausal atrophic vaginitis were also pertinent to this visit.  History Burgandy has no past medical history on file.   She has a past surgical history that includes Bunionectomy.   Her family history includes Alcohol abuse in her sister; Congestive Heart Failure in her father and mother; Hypertension in her father, mother, and sister; Mental illness in her sister; Mental retardation in her father; Osteoporosis in her mother.She reports that she has never smoked. She has never used smokeless tobacco. She reports that she drinks alcohol. She reports that she does not use drugs.  Outpatient Medications Prior to Visit  Medication Sig Dispense Refill  . calcium citrate-vitamin D (CITRACAL+D) 315-200 MG-UNIT per tablet Take 1 tablet by mouth 2 (two) times daily.    . Multiple Vitamin (MULTIVITAMIN) tablet Take 1 tablet by mouth daily.    Marland Kitchen  zolpidem (AMBIEN) 10 MG tablet Take 1 tablet (10 mg total) by mouth at bedtime as needed. 30 tablet 4   No facility-administered medications prior to visit.     Review of Systems   Patient denies headache, fevers, malaise, unintentional weight loss, skin  rash, eye pain, sinus congestion and sinus pain, sore throat, dysphagia,  hemoptysis , cough, dyspnea, wheezing, chest pain, palpitations, orthopnea, edema, abdominal pain, nausea, melena, diarrhea, constipation, flank pain, dysuria, hematuria, urinary  Frequency, nocturia, numbness, tingling, seizures,  Focal weakness, Loss of consciousness,  Tremor, insomnia, depression, anxiety, and suicidal ideation.     Objective:  BP 122/62 (BP Location: Left Arm, Patient Position: Sitting, Cuff Size: Normal)   Pulse 70   Temp 97.9 F (36.6 C) (Oral)   Resp 15   Ht 5\' 5"  (1.651 m)   Wt 144 lb 9.6 oz (65.6 kg)   SpO2 99%   BMI 24.06 kg/m   Physical Exam   General appearance: alert, cooperative and appears stated age Head: Normocephalic, without obvious abnormality, atraumatic Eyes: conjunctivae/corneas clear. PERRL, EOM's intact. Fundi benign. Ears: normal TM's and external ear canals both ears Nose: Nares normal. Septum midline. Mucosa normal. No drainage or sinus tenderness. Throat: lips, mucosa, and tongue normal; teeth and gums normal Neck: no adenopathy, no carotid bruit, no JVD, supple, symmetrical, trachea midline and thyroid not enlarged, symmetric, no tenderness/mass/nodules Lungs: clear to auscultation bilaterally Breasts: normal appearance, no masses or tenderness Heart: regular rate and rhythm, S1, S2 normal, no murmur, click, rub or gallop Abdomen: soft, non-tender; bowel sounds normal; no masses,  no organomegaly Extremities: extremities normal, atraumatic, no cyanosis or edema Pulses: 2+ and symmetric Skin: Skin color, texture, turgor normal. No rashes or lesions Neurologic: Alert and oriented X 3, normal strength and tone. Normal symmetric reflexes. Normal coordination and gait.      Assessment & Plan:   Problem List Items Addressed This Visit    Encounter for preventive health examination    Annual comprehensive preventive exam was done as well as an evaluation and  management of chronic conditions .  During the course of the visit the patient was educated and counseled about appropriate screening and preventive services including :  diabetes screening, lipid analysis with projected  10 year  risk for CAD , nutrition counseling, breast, cervical and colorectal cancer screening, and recommended immunizations.  Printed recommendations for health maintenance screenings was given      Postmenopausal atrophic vaginitis    Managed without medication        Other Visit Diagnoses    Fatigue, unspecified type    -  Primary   Relevant Orders   Comprehensive metabolic panel (Completed)   TSH (Completed)   Breast cancer screening       Relevant Orders   MM 3D SCREEN BREAST BILATERAL   Encounter for immunization       Relevant Orders   Flu vaccine HIGH DOSE PF    A total of 25 minutes of face to face time was spent with patient more than half of which was spent in counselling about the above mentioned conditions  and coordination of care   I have changed Carmisha M. Frickey's fluconazole. I am also having her start on Zoster Vaccine Adjuvanted. Additionally, I am having her maintain her multivitamin, calcium citrate-vitamin D, and zolpidem.  Meds ordered this encounter  Medications  . Zoster Vaccine Adjuvanted [facility]) injection    Sig: Inject 0.5 mLs into the muscle once for 1 dose.  Dispense:  1 each    Refill:  1  . zolpidem (AMBIEN) 10 MG tablet    Sig: Take 1 tablet (10 mg total) by mouth at bedtime as needed.    Dispense:  30 tablet    Refill:  4    Not to exceed 5 additional fills before 10/08/2017  . fluconazole (DIFLUCAN) 150 MG tablet    Sig: Take 1 tablet (150 mg total) by mouth daily.    Dispense:  2 tablet    Refill:  2    Medications Discontinued During This Encounter  Medication Reason  . zolpidem (AMBIEN) 10 MG tablet Reorder    Follow-up: No follow-ups on file.   Crecencio Mc, MD

## 2018-06-01 NOTE — Assessment & Plan Note (Signed)
Annual comprehensive preventive exam was done as well as an evaluation and management of chronic conditions .  During the course of the visit the patient was educated and counseled about appropriate screening and preventive services including :  diabetes screening, lipid analysis with projected  10 year  risk for CAD , nutrition counseling, breast, cervical and colorectal cancer screening, and recommended immunizations.  Printed recommendations for health maintenance screenings was given 

## 2018-06-01 NOTE — Assessment & Plan Note (Signed)
Managed without medication  

## 2018-06-02 ENCOUNTER — Ambulatory Visit: Payer: Medicare Other

## 2018-06-26 ENCOUNTER — Ambulatory Visit
Admission: RE | Admit: 2018-06-26 | Discharge: 2018-06-26 | Disposition: A | Discharge status: Home / Self Care | Payer: Medicare Other | Source: Ambulatory Visit | Attending: Internal Medicine | Admitting: Internal Medicine

## 2018-06-26 DIAGNOSIS — Z1239 Encounter for other screening for malignant neoplasm of breast: Secondary | ICD-10-CM

## 2018-06-26 DIAGNOSIS — Z1231 Encounter for screening mammogram for malignant neoplasm of breast: Secondary | ICD-10-CM | POA: Insufficient documentation

## 2018-10-06 ENCOUNTER — Ambulatory Visit: Payer: Medicare Other

## 2018-12-12 ENCOUNTER — Other Ambulatory Visit: Payer: Self-pay | Admitting: Internal Medicine

## 2018-12-12 MED ORDER — ZOLPIDEM TARTRATE 10 MG PO TABS: 10.0000 mg | ORAL_TABLET | Freq: Every evening | ORAL | 0 refills | Status: DC | PRN

## 2018-12-12 NOTE — Telephone Encounter (Signed)
Last fill was 05/30/18 at last Office visit ok to fill?

## 2019-01-22 ENCOUNTER — Ambulatory Visit: Payer: Medicare Other | Admitting: Internal Medicine

## 2019-01-28 ENCOUNTER — Other Ambulatory Visit: Payer: Self-pay

## 2019-01-28 ENCOUNTER — Ambulatory Visit (INDEPENDENT_AMBULATORY_CARE_PROVIDER_SITE_OTHER): Payer: Medicare Other | Admitting: Internal Medicine

## 2019-01-28 DIAGNOSIS — F5101 Primary insomnia: Secondary | ICD-10-CM | POA: Diagnosis not present

## 2019-01-28 DIAGNOSIS — K219 Gastro-esophageal reflux disease without esophagitis: Secondary | ICD-10-CM | POA: Diagnosis not present

## 2019-01-28 DIAGNOSIS — Z7189 Other specified counseling: Secondary | ICD-10-CM

## 2019-01-28 DIAGNOSIS — N952 Postmenopausal atrophic vaginitis: Secondary | ICD-10-CM

## 2019-01-28 MED ORDER — ZOLPIDEM TARTRATE 10 MG PO TABS: 10.0000 mg | ORAL_TABLET | Freq: Every evening | ORAL | 5 refills | Status: DC | PRN

## 2019-01-28 NOTE — Progress Notes (Signed)
Virtual Visit converted to Telephone  Note  This visit type was conducted due to national recommendations for restrictions regarding the COVID-19 pandemic (e.g. social distancing).  This format is felt to be most appropriate for this patient at this time.  All issues noted in this document were discussed and addressed.  No physical exam was performed (except for noted visual exam findings with Video Visits).    I attempted to connect with@ on 01/28/19 at 10:30 AM EDT by a video enabled telemedicine application ; however  Interactive audio and video telecommunications  Ultimately failed, due to patient having technical difficulties .   We continued and completed visit with audio only. I and verified that I am speaking with the correct person using two identifiers.  Location patient: home Location provider: work or home office Persons participating in the virtual visit: patient, provider  I discussed the limitations, risks, security and privacy concerns of performing an evaluation and management service by telephone and the availability of in person appointments. I also discussed with the patient that there may be a patient responsible charge related to this service. The patient expressed understanding and agreed to proceed.  Reason for visit: follow up on  Chronic insomnia managed with Lorrin Mais  HPI:  68 yr old female with chronic insomnia managed with 5 mg ambien , requesting refill.  She is using the [place] most nights,  But has on occasion been able to sleep without it.  No nocutrnal wanderings or odd behaviors.  Still working full time.    The patient has no signs or symptoms of COVID 19 infection (fever, cough, sore throat  or shortness of breath beyond what is typical for patient).  Patient denies contact with other persons with the above mentioned symptoms or with anyone confirmed to have COVID 19   She has had increased anxiety over the health problems of her son Cherryl Babin and is  inquiring about whether I would take him on as a patient to oversee his workup    ROS: See pertinent positives and negatives per HPI.  No past medical history on file.  Past Surgical History:  Procedure Laterality Date  . BREAST BIOPSY Left 09/09/2009   neg  . BUNIONECTOMY      Family History  Problem Relation Age of Onset  . Hypertension Mother   . Osteoporosis Mother   . Congestive Heart Failure Mother   . Hypertension Father   . Mental retardation Father        dementia  . Congestive Heart Failure Father   . Alcohol abuse Sister   . Mental illness Sister   . Hypertension Sister     SOCIAL HX: married, no alcohol or tobacco.  Full time administrator    Current Outpatient Medications:  .  calcium citrate-vitamin D (CITRACAL+D) 315-200 MG-UNIT per tablet, Take 1 tablet by mouth 2 (two) times daily., Disp: , Rfl:  .  fluconazole (DIFLUCAN) 150 MG tablet, Take 1 tablet (150 mg total) by mouth daily., Disp: 2 tablet, Rfl: 2 .  Multiple Vitamin (MULTIVITAMIN) tablet, Take 1 tablet by mouth daily., Disp: , Rfl:  .  zolpidem (AMBIEN) 10 MG tablet, Take 1 tablet (10 mg total) by mouth at bedtime as needed., Disp: 30 tablet, Rfl: 5  EXAM:  VITALS per patient if applicable:  GENERAL: alert, oriented, appears well and in no acute distress  HEENT: atraumatic, conjunttiva clear, no obvious abnormalities on inspection of external nose and ears  NECK: normal movements of the head  and neck  LUNGS: on inspection no signs of respiratory distress, breathing rate appears normal, no obvious gross SOB, gasping or wheezing  CV: no obvious cyanosis  MS: moves all visible extremities without noticeable abnormality  PSYCH/NEURO: pleasant and cooperative, no obvious depression or anxiety, speech and thought processing grossly intact  ASSESSMENT AND PLAN:  Discussed the following assessment and plan:    Insomnia She has tried the natural remedies for insomnia including herbal tea  and melatonin, and adheres to the principles of good sleep hygiene.  Trial of lunesta was also not helpful.  I have reviewed the dangers of prescribing this drug to the elderly with patient.  She prefers to continue using [place]  .  Refills given   GERD (gastroesophageal reflux disease) Daily symptoms have resolved with weight loss.  Continue prn [place] of famotidine   Postmenopausal atrophic vaginitis Managed without medication   Educated About Covid-19 Virus Infection Educated patient on the newly broadened list of signs and symptoms of COVID-19 infection and ways to avoid the viral infection including washing hands frequently with soap and water,  using hand sanitizer if unable to wash, avoiding touching face,  staying at home and limiting visitors,  and avoiding contact with people coming in and out of home.  Discussed the potential ineffectiveness of hand sanitizer if left in environments > 110 degrees (ie , the car).  Reminded patient to call office with questions/concerns.  The importance of social distancing was discussed today    I discussed the assessment and treatment plan with the patient. The patient was provided an opportunity to ask questions and all were answered. The patient agreed with the plan and demonstrated an understanding of the instructions.   The patient was advised to call back or seek an in-person evaluation if the symptoms worsen or if the condition fails to improve as anticipated.  I provided 15 minutes of non-face-to-face time during this encounter.   Crecencio Mc, MD

## 2019-01-29 DIAGNOSIS — Z7189 Other specified counseling: Secondary | ICD-10-CM | POA: Insufficient documentation

## 2019-01-29 NOTE — Assessment & Plan Note (Signed)
Daily symptoms have resolved with weight loss.  Continue prn [place] of famotidine

## 2019-01-29 NOTE — Assessment & Plan Note (Signed)
She has tried the natural remedies for insomnia including herbal tea and melatonin, and adheres to the principles of good sleep hygiene.  Trial of lunesta was also not helpful.  I have reviewed the dangers of prescribing this drug to the elderly with patient.  She prefers to continue using ambien  .  Refills given  

## 2019-01-29 NOTE — Assessment & Plan Note (Signed)
Managed without medication

## 2019-01-29 NOTE — Assessment & Plan Note (Signed)

## 2019-03-10 DIAGNOSIS — H43811 Vitreous degeneration, right eye: Secondary | ICD-10-CM | POA: Diagnosis not present

## 2019-05-07 ENCOUNTER — Encounter: Payer: Self-pay | Admitting: Internal Medicine

## 2019-05-22 ENCOUNTER — Ambulatory Visit (INDEPENDENT_AMBULATORY_CARE_PROVIDER_SITE_OTHER): Payer: Medicare Other

## 2019-05-22 ENCOUNTER — Other Ambulatory Visit: Payer: Self-pay

## 2019-05-22 DIAGNOSIS — Z23 Encounter for immunization: Secondary | ICD-10-CM

## 2019-06-03 ENCOUNTER — Encounter: Payer: Medicare Other | Admitting: Internal Medicine

## 2019-06-08 ENCOUNTER — Other Ambulatory Visit: Payer: Self-pay

## 2019-06-10 ENCOUNTER — Ambulatory Visit (INDEPENDENT_AMBULATORY_CARE_PROVIDER_SITE_OTHER): Payer: Medicare Other | Admitting: Internal Medicine

## 2019-06-10 ENCOUNTER — Encounter: Payer: Self-pay | Admitting: Internal Medicine

## 2019-06-10 ENCOUNTER — Other Ambulatory Visit: Payer: Self-pay

## 2019-06-10 VITALS — BP 128/88 | HR 74 | Temp 97.4°F | Resp 15 | Ht 65.0 in | Wt 145.8 lb

## 2019-06-10 DIAGNOSIS — Z7189 Other specified counseling: Secondary | ICD-10-CM

## 2019-06-10 DIAGNOSIS — M8580 Other specified disorders of bone density and structure, unspecified site: Secondary | ICD-10-CM | POA: Diagnosis not present

## 2019-06-10 DIAGNOSIS — E785 Hyperlipidemia, unspecified: Secondary | ICD-10-CM

## 2019-06-10 DIAGNOSIS — F5101 Primary insomnia: Secondary | ICD-10-CM

## 2019-06-10 DIAGNOSIS — Z780 Asymptomatic menopausal state: Secondary | ICD-10-CM | POA: Insufficient documentation

## 2019-06-10 DIAGNOSIS — E559 Vitamin D deficiency, unspecified: Secondary | ICD-10-CM | POA: Diagnosis not present

## 2019-06-10 DIAGNOSIS — R5383 Other fatigue: Secondary | ICD-10-CM | POA: Diagnosis not present

## 2019-06-10 DIAGNOSIS — Z0000 Encounter for general adult medical examination without abnormal findings: Secondary | ICD-10-CM

## 2019-06-10 MED ORDER — ZOSTER VAC RECOMB ADJUVANTED 50 MCG/0.5ML IM SUSR: 0.5000 mL | Freq: Once | INTRAMUSCULAR | 1 refills | Status: AC

## 2019-06-10 NOTE — Progress Notes (Signed)
Patient ID: Kimberly Gill, female    DOB: Sep 26, 1950  Age: 68 y.o. MRN: [id number]  The patient is here for follow up and examination and management of other chronic and acute problems.   DEXA -2.3 spine 2016 dine by gyn at [facility]  2017  tubular adenoma Elliott 5 YR FOLLOW UP  The risk factors are reflected in the social history.  The roster of all physicians providing medical care to patient - is listed in the Snapshot section of the chart.  Activities of daily living:  The patient is 100% independent in all ADLs: dressing, toileting, feeding as well as independent mobility  Home safety : The patient has smoke detectors in the home. They wear seatbelts.  There are no firearms at home. There is no violence in the home.   There is no risks for hepatitis, STDs or HIV. There is no   history of blood transfusion. They have no travel history to infectious disease endemic areas of the world.  The patient has seen their dentist in the last six month. They have seen their eye doctor in the last year. They admit to slight hearing difficulty with regard to whispered voices and some television programs.  They have deferred audiologic testing in the last year.  They do not  have excessive sun exposure. Discussed the need for sun protection: hats, long sleeves and use of sunscreen if there is significant sun exposure.   Diet: the importance of a healthy diet is discussed. They do have a healthy diet.  The benefits of regular aerobic exercise were discussed. She walks 4 times per week ,  20 minutes.   Depression screen: there are no signs or vegative symptoms of depression- irritability, change in appetite, anhedonia, sadness/tearfullness.  Cognitive assessment: the patient manages all their financial and personal affairs and is actively engaged. They could relate day,date,year and events; recalled 2/3 objects at 3 minutes; performed clock-face test normally.  The following portions of  the patient's history were reviewed and updated as appropriate: allergies, current medications, past family history, past medical history,  past surgical history, past social history  and problem list.  Visual acuity was not assessed per patient preference since she has regular follow up with her ophthalmologist. Hearing and body mass index were assessed and reviewed.   During the course of the visit the patient was educated and counseled about appropriate screening and preventive services including : fall prevention , diabetes screening, nutrition counseling, colorectal cancer screening, and recommended immunizations.    CC: The primary encounter diagnosis was Fatigue, unspecified type. Diagnoses of Hyperlipidemia LDL goal <130, Vitamin D deficiency, Postmenopausal estrogen deficiency, Educated about COVID-19 virus infection, Encounter for preventive health examination, Primary insomnia, and Osteopenia after menopause were also pertinent to this visit.  The patient has no signs or symptoms of COVID 19 infection (fever, cough, sore throat  or shortness of breath beyond what is typical for patient).  Patient denies contact with other persons with the above mentioned symptoms or with anyone confirmed to have [place] has no past medical history on file.   She has a past surgical history that includes Bunionectomy and Breast biopsy (Left, 09/09/2009).   Her family history includes Alcohol abuse in her sister; Congestive Heart Failure in her father and mother; Hypertension in her father, mother, and sister; Mental illness in her sister; Mental retardation in her father; Osteoporosis in her mother.She reports that she has never smoked. She  has never used smokeless tobacco. She reports current alcohol use. She reports that she does not use drugs.  Outpatient Medications Prior to Visit  Medication Sig Dispense Refill  . calcium citrate-vitamin D (CITRACAL+D) 315-200 MG-UNIT per tablet  Take 1 tablet by mouth 2 (two) times daily.    . fluconazole (DIFLUCAN) 150 MG tablet Take 1 tablet (150 mg total) by mouth daily. 2 tablet 2  . Multiple Vitamin (MULTIVITAMIN) tablet Take 1 tablet by mouth daily.    Marland Kitchen zolpidem (AMBIEN) 10 MG tablet Take 1 tablet (10 mg total) by mouth at bedtime as needed. 30 tablet 5   No facility-administered medications prior to visit.     Review of Systems   Patient denies headache, fevers, malaise, unintentional weight loss, skin rash, eye pain, sinus congestion and sinus pain, sore throat, dysphagia,  hemoptysis , cough, dyspnea, wheezing, chest pain, palpitations, orthopnea, edema, abdominal pain, nausea, melena, diarrhea, constipation, flank pain, dysuria, hematuria, urinary  Frequency, nocturia, numbness, tingling, seizures,  Focal weakness, Loss of consciousness,  Tremor, insomnia, depression, anxiety, and suicidal ideation.      Objective:  BP 128/88 (BP Location: Left Arm, Patient Position: Sitting, Cuff Size: Normal)   Pulse 74   Temp (!) 97.4 F (36.3 C) (Temporal)   Resp 15   Ht 5\' 5"  (1.651 m)   Wt 145 lb 12.8 oz (66.1 kg)   SpO2 99%   BMI 24.26 kg/m   Physical Exam    General appearance: alert, cooperative and appears stated age Head: Normocephalic, without obvious abnormality, atraumatic Eyes: conjunctivae/corneas clear. PERRL, EOM's intact. Fundi benign. Ears: normal TM's and external ear canals both ears Nose: Nares normal. Septum midline. Mucosa normal. No drainage or sinus tenderness. Throat: lips, mucosa, and tongue normal; teeth and gums normal Neck: no adenopathy, no carotid bruit, no JVD, supple, symmetrical, trachea midline and thyroid not enlarged, symmetric, no tenderness/mass/nodules Lungs: clear to auscultation bilaterally Breasts: normal appearance, no masses or tenderness Heart: regular rate and rhythm, S1, S2 normal, no murmur, click, rub or gallop Abdomen: soft, non-tender; bowel sounds normal; no masses,  no  organomegaly Extremities: extremities normal, atraumatic, no cyanosis or edema Pulses: 2+ and symmetric Skin: Skin color, texture, turgor normal. No rashes or lesions Neurologic: Alert and oriented X 3, normal strength and tone. Normal symmetric reflexes. Normal coordination and gait.      Assessment & Plan:   Problem List Items Addressed This Visit      Unprioritized   Encounter for preventive health examination    age appropriate education and counseling updated, referrals for preventative services and immunizations addressed, dietary and smoking counseling addressed, most recent labs reviewed.  I have personally reviewed and have noted:  1) the patient's medical and social history 2) The pt's use of alcohol, tobacco, and illicit drugs 3) The patient's current medications and supplements 4) Functional ability including ADL's, fall risk, home safety risk, hearing and visual impairment 5) Diet and physical activities 6) Evidence for depression or mood disorder 7) The patient's height, weight, and BMI have been recorded in the chart  I have made referrals, and provided counseling and education based on review of the above      Insomnia    She has tried the natural remedies for insomnia including herbal tea and melatonin, and adheres to the principles of good sleep hygiene.  Trial of lunesta was also not helpful.  I have reviewed the dangers of prescribing this drug to the elderly with patient.  She  prefers to continue using [place]  .  Refills given       Educated about COVID-19 virus infection    Patient was screened for the development of any unsafe behaviors or habits that may have developed as a result of the social impact of the virus , including alcohol abuse,  Domestic violence, tobacco abuse and overeating.   She has felt increased anxiety due to the progressive loss of civility exhibited by strangers who disagree with her careful approach to covid  protection        Osteopenia after menopause    DEXA ordered  T scores were -2.3 in 2016 .  Calcium and Vitamin d needs reviewed         Other Visit Diagnoses    Fatigue, unspecified type    -  Primary   Relevant Orders   Comprehensive metabolic panel   Lipid panel   TSH   CBC with Differential/Platelet   Hyperlipidemia LDL goal <130       Relevant Orders   Lipid panel   Vitamin D deficiency       Relevant Orders   VITAMIN D 25 Hydroxy (Vit-D Deficiency, Fractures)   Postmenopausal estrogen deficiency       Relevant Orders   DG Bone Density      I am having Darlyn C. Hupp start on Zoster Vaccine Adjuvanted. I am also having her maintain her multivitamin, calcium citrate-vitamin D, fluconazole, and zolpidem.  Meds ordered this encounter  Medications  . Zoster Vaccine Adjuvanted [facility]) injection    Sig: Inject 0.5 mLs into the muscle once for 1 dose.    Dispense:  1 each    Refill:  1  .  A total of 40 minutes was spent with patient more than half of which was spent in counseling patient on the above mentioned issues , reviewing and explaining recent labs and imaging studies done, and coordination of care.   There are no discontinued medications.  Follow-up: Return in about 1 year (around 06/09/2020).   Crecencio Mc, MD

## 2019-06-10 NOTE — Patient Instructions (Signed)
DEXA scan has been ordered     The ShingRx vaccine is now available in local pharmacies and is much more protective than the old one  Zostavax  (it is about 97%  Effective in preventing shingles). .   It is therefore ADVISED for all interested adults over 50 to prevent shingles so I have printed you a prescription for it.  (it requires a 2nd dose 2 to 6 months after the first one) .  It will cause you to have flu  like symptoms for 2 days If your pharmacy is not available to give it to you,  The [facility] pharmacy will give it to you.  They are located on the ground floor of the [place] [facility] After Age 90 After age 24, you are at a higher risk for certain long-term diseases and infections as well as injuries from falls. Falls are a major cause of broken bones and head injuries in people who are older than age 66. Getting regular preventive care can help to keep you healthy and well. Preventive care includes getting regular testing and making lifestyle changes as recommended by your health care provider. Talk with your health care provider about:  Which screenings and tests you should have. A screening is a test that checks for a disease when you have no symptoms.  A diet and exercise plan that is right for you. What should I know about screenings and tests to prevent falls? Screening and testing are the best ways to find a health problem early. Early diagnosis and treatment give you the best chance of managing medical conditions that are common after age 104. Certain conditions and lifestyle choices may make you more likely to have a fall. Your health care provider may recommend:  Regular vision checks. Poor vision and conditions such as cataracts can make you more likely to have a fall. If you wear glasses, make sure to get your prescription updated if your vision changes.  Medicine review. Work with your health care provider to regularly review all of  the medicines you are taking, including over-the-counter medicines. Ask your health care provider about any side effects that may make you more likely to have a fall. Tell your health care provider if any medicines that you take make you feel dizzy or sleepy.  Osteoporosis screening. Osteoporosis is a condition that causes the bones to get weaker. This can make the bones weak and cause them to break more easily.  Blood pressure screening. Blood pressure changes and medicines to control blood pressure can make you feel dizzy.  Strength and balance checks. Your health care provider may recommend certain tests to check your strength and balance while standing, walking, or changing positions.  Foot health exam. Foot pain and numbness, as well as not wearing proper footwear, can make you more likely to have a fall.  Depression screening. You may be more likely to have a fall if you have a fear of falling, feel emotionally low, or feel unable to do activities that you used to do.  Alcohol use screening. Using too much alcohol can affect your balance and may make you more likely to have a fall. What actions can I take to lower my risk of falls? General instructions  Talk with your health care provider about your risks for falling. Tell your health care provider if: ? You fall. Be sure to tell your health care provider about all falls, even ones that  seem minor. ? You feel dizzy, sleepy, or off-balance.  Take over-the-counter and prescription medicines only as told by your health care provider. These include any supplements.  Eat a healthy diet and maintain a healthy weight. A healthy diet includes low-fat dairy products, low-fat (lean) meats, and fiber from whole grains, beans, and lots of fruits and vegetables. Home safety  Remove any tripping hazards, such as rugs, cords, and clutter.  Install safety equipment such as grab bars in bathrooms and safety rails on stairs.  Keep rooms and walkways  well-lit. Activity   Follow a regular exercise program to stay fit. This will help you maintain your balance. Ask your health care provider what types of exercise are appropriate for you.  If you need a cane or walker, use it as recommended by your health care provider.  Wear supportive shoes that have nonskid soles. Lifestyle  Do not drink alcohol if your health care provider tells you not to drink.  If you drink alcohol, limit how much you have: ? 0-1 drink a day for women. ? 0-2 drinks a day for men.  Be aware of how much alcohol is in your drink. In the [place] one drink equals one typical bottle of beer (12 oz), one-half glass of wine (5 oz), or one shot of hard liquor (1 oz).  Do not use any products that contain nicotine or tobacco, such as cigarettes and e-cigarettes. If you need help quitting, ask your health care provider. Summary  Having a healthy lifestyle and getting preventive care can help to protect your health and wellness after age 37.  Screening and testing are the best way to find a health problem early and help you avoid having a fall. Early diagnosis and treatment give you the best chance for managing medical conditions that are more common for people who are older than age 50.  Falls are a major cause of broken bones and head injuries in people who are older than age 52. Take precautions to prevent a fall at home.  Work with your health care provider to learn what changes you can make to improve your health and wellness and to prevent falls. This information is not intended to replace advice given to you by your health care provider. Make sure you discuss any questions you have with your health care provider. Document Released: 06/19/2017 Document Revised: 11/27/2018 Document Reviewed: 06/19/2017 Elsevier Patient Education  2020 [employer].

## 2019-06-10 NOTE — Assessment & Plan Note (Addendum)
DEXA ordered  T scores were -2.3 in 2016 .  Calcium and Vitamin d needs reviewed

## 2019-06-10 NOTE — Assessment & Plan Note (Signed)

## 2019-06-10 NOTE — Assessment & Plan Note (Addendum)
Patient was screened for the development of any unsafe behaviors or habits that may have developed as a result of the social impact of the virus , including alcohol abuse,  Domestic violence, tobacco abuse and overeating.   She has felt increased anxiety due to the progressive loss of civility exhibited by strangers who disagree with her careful approach to covid  protection

## 2019-06-10 NOTE — Assessment & Plan Note (Signed)
She has tried the natural remedies for insomnia including herbal tea and melatonin, and adheres to the principles of good sleep hygiene.  Trial of lunesta was also not helpful.  I have reviewed the dangers of prescribing this drug to the elderly with patient.  She prefers to continue using ambien  .  Refills given  

## 2019-06-11 LAB — CBC WITH DIFFERENTIAL/PLATELET
Basophils Absolute: 0.1 10*3/uL (ref 0.0–0.1)
Basophils Relative: 1 % (ref 0.0–3.0)
Eosinophils Absolute: 0 10*3/uL (ref 0.0–0.7)
Eosinophils Relative: 0.6 % (ref 0.0–5.0)
HCT: 40.1 % (ref 36.0–46.0)
Hemoglobin: 13.3 g/dL (ref 12.0–15.0)
Lymphocytes Relative: 37 % (ref 12.0–46.0)
Lymphs Abs: 2.8 10*3/uL (ref 0.7–4.0)
MCHC: 33.1 g/dL (ref 30.0–36.0)
MCV: 94.6 fl (ref 78.0–100.0)
Monocytes Absolute: 0.7 10*3/uL (ref 0.1–1.0)
Monocytes Relative: 10 % (ref 3.0–12.0)
Neutro Abs: 3.8 10*3/uL (ref 1.4–7.7)
Neutrophils Relative %: 51.4 % (ref 43.0–77.0)
Platelets: 239 10*3/uL (ref 150.0–400.0)
RBC: 4.24 Mil/uL (ref 3.87–5.11)
RDW: 13.8 % (ref 11.5–15.5)
WBC: 7.5 10*3/uL (ref 4.0–10.5)

## 2019-06-11 LAB — COMPREHENSIVE METABOLIC PANEL
ALT: 16 U/L (ref 0–35)
AST: 24 U/L (ref 0–37)
Albumin: 4.6 g/dL (ref 3.5–5.2)
Alkaline Phosphatase: 56 U/L (ref 39–117)
BUN: 10 mg/dL (ref 6–23)
CO2: 28 mEq/L (ref 19–32)
Calcium: 9.9 mg/dL (ref 8.4–10.5)
Chloride: 100 mEq/L (ref 96–112)
Creatinine, Ser: 0.82 mg/dL (ref 0.40–1.20)
GFR: 69.05 mL/min (ref >60.00)
Glucose, Bld: 87 mg/dL (ref 70–99)
Potassium: 4 mEq/L (ref 3.5–5.1)
Sodium: 138 mEq/L (ref 135–145)
Total Bilirubin: 0.6 mg/dL (ref 0.2–1.2)
Total Protein: 7.3 g/dL (ref 6.0–8.3)

## 2019-06-11 LAB — LIPID PANEL
Cholesterol: 239 mg/dL — ABNORMAL HIGH (ref 0–200)
HDL: 98.9 mg/dL (ref >39.00)
LDL Cholesterol: 128 mg/dL — ABNORMAL HIGH (ref 0–99)
NonHDL: 139.74
Total CHOL/HDL Ratio: 2
Triglycerides: 60 mg/dL (ref 0.0–149.0)
VLDL: 12 mg/dL (ref 0.0–40.0)

## 2019-06-11 LAB — TSH: TSH: 3.33 u[IU]/mL (ref 0.35–4.50)

## 2019-06-11 LAB — VITAMIN D 25 HYDROXY (VIT D DEFICIENCY, FRACTURES): VITD: 36.46 ng/mL (ref 30.00–100.00)

## 2019-08-02 ENCOUNTER — Other Ambulatory Visit: Payer: Self-pay | Admitting: Internal Medicine

## 2019-08-03 NOTE — Telephone Encounter (Signed)
Refilled: 01/28/2019 Last OV: 06/10/2019 Next OV: 06/16/2020

## 2019-08-04 NOTE — Telephone Encounter (Signed)
Spoke with Kimberly Gill and she has been scheduled for a virtual visit with Dr. Derrel Nip on 08/07/2019. Kimberly Gill is aware of appt date and time.

## 2019-08-07 ENCOUNTER — Other Ambulatory Visit: Payer: Self-pay

## 2019-08-07 ENCOUNTER — Ambulatory Visit (INDEPENDENT_AMBULATORY_CARE_PROVIDER_SITE_OTHER): Payer: Medicare Other | Admitting: Internal Medicine

## 2019-08-07 ENCOUNTER — Ambulatory Visit: Payer: Medicare Other | Admitting: Internal Medicine

## 2019-08-07 ENCOUNTER — Encounter: Payer: Self-pay | Admitting: Internal Medicine

## 2019-08-07 DIAGNOSIS — H60551 Acute reactive otitis externa, right ear: Secondary | ICD-10-CM | POA: Diagnosis not present

## 2019-08-07 MED ORDER — ACETIC ACID 2 % OT SOLN: 4.0000 [drp] | OTIC | 0 refills | Status: DC

## 2019-08-07 NOTE — Progress Notes (Signed)
Virtual Visit via [email or web address]  This visit type was conducted due to national recommendations for restrictions regarding the COVID-19 pandemic (e.g. social distancing).  This format is felt to be most appropriate for this patient at this time.  All issues noted in this document were discussed and addressed.  No physical exam was performed (except for noted visual exam findings with Video Visits).   I connected with@ on 08/07/19 at  4:00 PM EST by a video enabled telemedicine application  and verified that I am speaking with the correct person using two identifiers. Location patient: home Location provider: work or home office Persons participating in the virtual visit: patient, provider  I discussed the limitations, risks, security and privacy concerns of performing an evaluation and management service by telephone and the availability of in person appointments. I also discussed with the patient that there may be a patient responsible charge related to this service. The patient expressed understanding and agreed to proceed.  Reason for visit: right ear pain   HPI:  68 yr old female with history of cerumen impaction presents with ear fullness and decreased hearing in the right ear that started  4 or 5 days ago. Mild pressure,  Loss of hearing,  Ear feels blocked .  Started using debrox once daily which has helped.  No fevers,  pain, bleeding , no sinus pain or ear popping.   ROS: See pertinent positives and negatives per HPI.  No past medical history on file.  Past Surgical History:  Procedure Laterality Date  . BREAST BIOPSY Left 09/09/2009   neg  . BUNIONECTOMY      Family History  Problem Relation Age of Onset  . Hypertension Mother   . Osteoporosis Mother   . Congestive Heart Failure Mother   . Hypertension Father   . Mental retardation Father        dementia  . Congestive Heart Failure Father   . Alcohol abuse Sister   . Mental illness Sister   . Hypertension Sister      SOCIAL HX:  reports that she has never smoked. She has never used smokeless tobacco. She reports current alcohol use. She reports that she does not use drugs.   Current Outpatient Medications:  .  calcium citrate-vitamin D (CITRACAL+D) 315-200 MG-UNIT per tablet, Take 1 tablet by mouth 2 (two) times daily., Disp: , Rfl:  .  Multiple Vitamin (MULTIVITAMIN) tablet, Take 1 tablet by mouth daily., Disp: , Rfl:  .  zolpidem (AMBIEN) 10 MG tablet, TAKE 1 TABLET (10 MG TOTAL) BY MOUTH AT BEDTIME AS NEEDED., Disp: 30 tablet, Rfl: 5 .  acetic acid 2 % otic solution, Place 4 drops into the right ear every 3 (three) hours., Disp: 15 mL, Rfl: 0  EXAM:  VITALS per patient if applicable:  GENERAL: alert, oriented, appears well and in no acute distress  HEENT: atraumatic, conjunttiva clear, no obvious abnormalities on inspection of external nose and ears  NECK: normal movements of the head and neck  LUNGS: on inspection no signs of respiratory distress, breathing rate appears normal, no obvious gross SOB, gasping or wheezing  CV: no obvious cyanosis  MS: moves all visible extremities without noticeable abnormality  PSYCH/NEURO: pleasant and cooperative, no obvious depression or anxiety, speech and thought processing grossly intact  ASSESSMENT AND PLAN:  Discussed the following assessment and plan:  Acute reactive otitis externa of right ear  Otitis externa, acute noninfectious No signs or symptoms of otitis media.  Symptoms may be due  to  cerumen impaction.  Continue debrox,  acetic acid prescribed     I discussed the assessment and treatment plan with the patient. The patient was provided an opportunity to ask questions and all were answered. The patient agreed with the plan and demonstrated an understanding of the instructions.   The patient was advised to call back or seek an in-person evaluation if the symptoms worsen or if the condition fails to improve as anticipated.  I  provided 15 minutes of non-face-to-face time during this encounter.   Crecencio Mc, MD

## 2019-08-09 DIAGNOSIS — H60509 Unspecified acute noninfective otitis externa, unspecified ear: Secondary | ICD-10-CM | POA: Insufficient documentation

## 2019-08-09 NOTE — Assessment & Plan Note (Addendum)
No signs or symptoms of otitis media.  Symptoms may be due to  cerumen impaction.  Continue debrox,  acetic acid prescribed

## 2019-09-18 ENCOUNTER — Ambulatory Visit: Payer: Medicare Other

## 2019-09-24 ENCOUNTER — Other Ambulatory Visit: Payer: Self-pay | Admitting: Internal Medicine

## 2019-09-24 DIAGNOSIS — Z1231 Encounter for screening mammogram for malignant neoplasm of breast: Secondary | ICD-10-CM

## 2019-09-25 ENCOUNTER — Ambulatory Visit: Payer: Medicare Other | Attending: Internal Medicine

## 2019-09-25 DIAGNOSIS — Z23 Encounter for immunization: Secondary | ICD-10-CM

## 2019-09-25 NOTE — Progress Notes (Signed)
   Covid-19 Vaccination Clinic  Name:  Zykeriah Weiss    MRN: [id number] DOB: May 27, 1951  09/25/2019  Ms. Headden was observed post Covid-19 immunization for 15 minutes without incidence. She was provided with Vaccine Information Sheet and instruction to access the V-Safe system.   Ms. Mcguffie was instructed to call 911 with any severe reactions post vaccine: Marland Kitchen Difficulty breathing  . Swelling of your face and throat  . A fast heartbeat  . A bad rash all over your body  . Dizziness and weakness    Immunizations Administered    Name Date Dose VIS Date Route   Pfizer COVID-19 Vaccine 09/25/2019  8:49 AM 0.3 mL 07/31/2019 Intramuscular   Manufacturer: [place]   Lot: [id number]   [place]: [id number]

## 2019-10-05 ENCOUNTER — Ambulatory Visit: Payer: Medicare Other

## 2019-10-20 ENCOUNTER — Ambulatory Visit: Payer: Medicare Other | Attending: Internal Medicine

## 2019-10-20 DIAGNOSIS — Z23 Encounter for immunization: Secondary | ICD-10-CM | POA: Insufficient documentation

## 2019-10-20 NOTE — Progress Notes (Signed)
   Covid-19 Vaccination Clinic  Name:  Kimberly Gill    MRN: [id number] DOB: Jun 17, 1951  10/20/2019  [name] was observed post Covid-19 immunization for 15 minutes without incident. She was provided with Vaccine Information Sheet and instruction to access the V-Safe system.   [name] was instructed to call 911 with any severe reactions post vaccine: Marland Kitchen Difficulty breathing  . Swelling of face and throat  . A fast heartbeat  . A bad rash all over body  . Dizziness and weakness   Immunizations Administered    Name Date Dose VIS Date Route   Pfizer COVID-19 Vaccine 10/20/2019  8:53 AM 0.3 mL 07/31/2019 Intramuscular   Manufacturer: [place]   Lot: [id number]   [place]: [id number]

## 2019-11-03 ENCOUNTER — Ambulatory Visit: Payer: Medicare Other

## 2019-11-27 ENCOUNTER — Telehealth: Payer: Medicare Other | Admitting: Physician Assistant

## 2019-11-27 ENCOUNTER — Telehealth: Payer: Self-pay | Admitting: Internal Medicine

## 2019-11-27 DIAGNOSIS — R399 Unspecified symptoms and signs involving the genitourinary system: Secondary | ICD-10-CM

## 2019-11-27 MED ORDER — NITROFURANTOIN MONOHYD MACRO 100 MG PO CAPS: 100.0000 mg | ORAL_CAPSULE | Freq: Two times a day (BID) | ORAL | 0 refills | Status: AC

## 2019-11-27 NOTE — Telephone Encounter (Signed)
Spoke with pt to make sure she was going to be able to get treated today because Dr. Derrel Nip will not be back in the office until Tuesday and we have no appts available in our office today. Pt stated that she did do an E-visit and was able to get treated. She stated that if she does not start to feel better she will reach back out to [place].

## 2019-11-27 NOTE — Telephone Encounter (Signed)
Pt called in and thinks she has UTI. Advised patient we have no available appts here or [facility]. Let her know I would let provider know.

## 2019-11-27 NOTE — Telephone Encounter (Signed)
LMTCB

## 2019-11-27 NOTE — Progress Notes (Signed)

## 2019-12-02 ENCOUNTER — Ambulatory Visit
Admission: RE | Admit: 2019-12-02 | Discharge: 2019-12-02 | Disposition: A | Discharge status: Home / Self Care | Payer: Medicare Other | Source: Ambulatory Visit | Attending: Internal Medicine | Admitting: Internal Medicine

## 2019-12-02 DIAGNOSIS — Z1231 Encounter for screening mammogram for malignant neoplasm of breast: Secondary | ICD-10-CM | POA: Diagnosis not present

## 2019-12-11 IMAGING — MG DIGITAL SCREENING BILATERAL MAMMOGRAM WITH TOMO AND CAD
8 series · 8 of 24 positions shown · non-contrast
Comparison: Previous exam(s).

CLINICAL DATA: Screening.

EXAM:
DIGITAL SCREENING BILATERAL MAMMOGRAM WITH TOMO AND CAD

[L CC synth-2D]
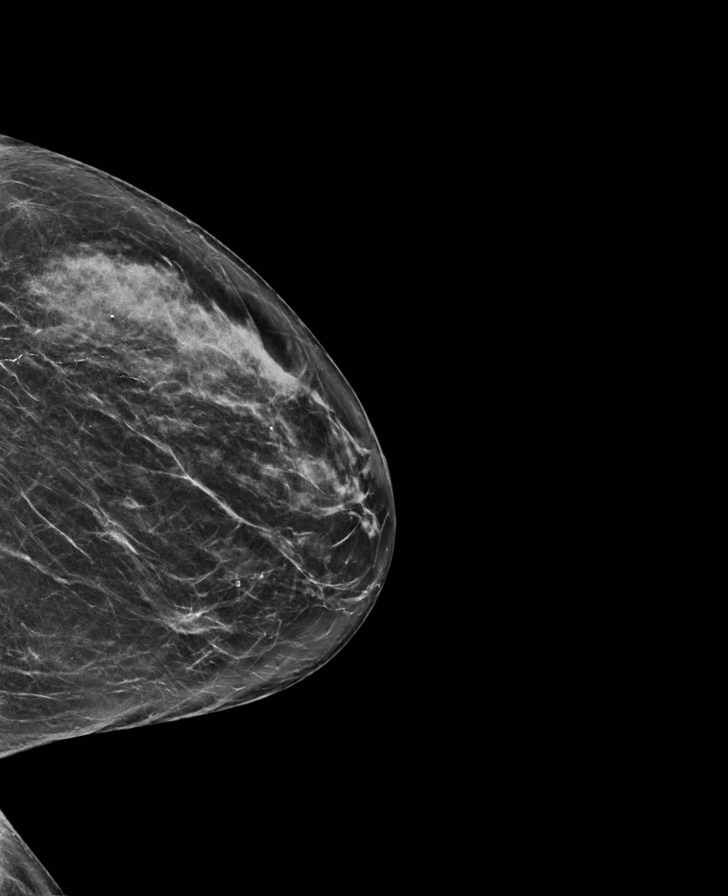

[R MLO synth-2D]
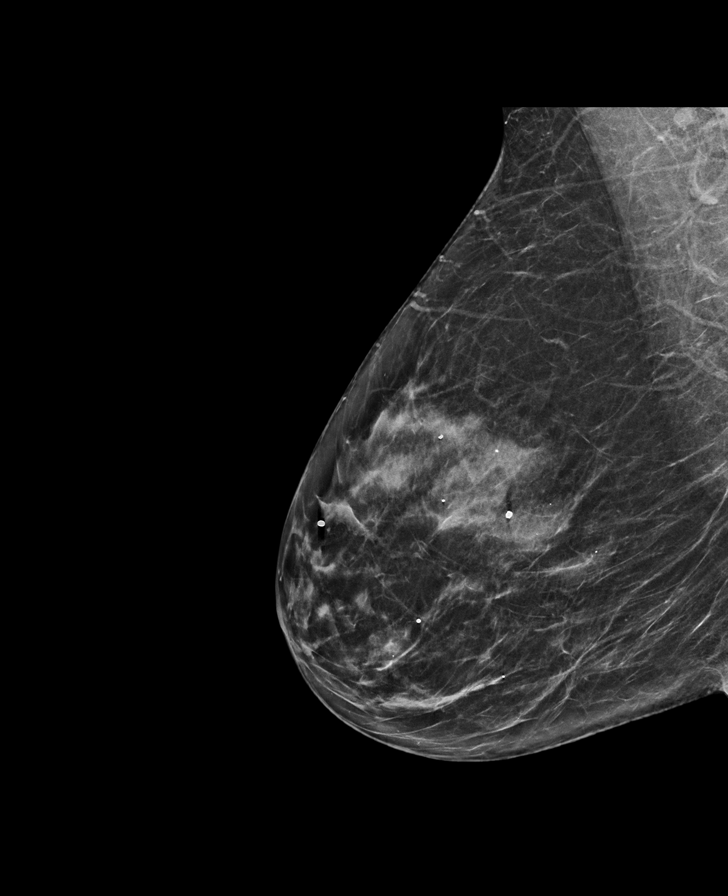

[L MLO synth-2D]
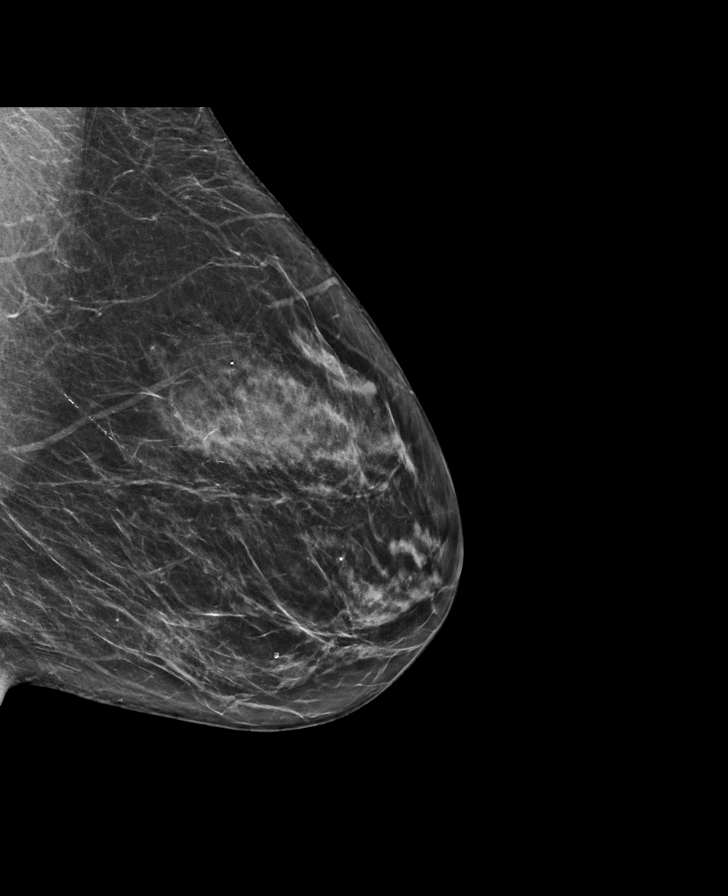

[R CC synth-2D]
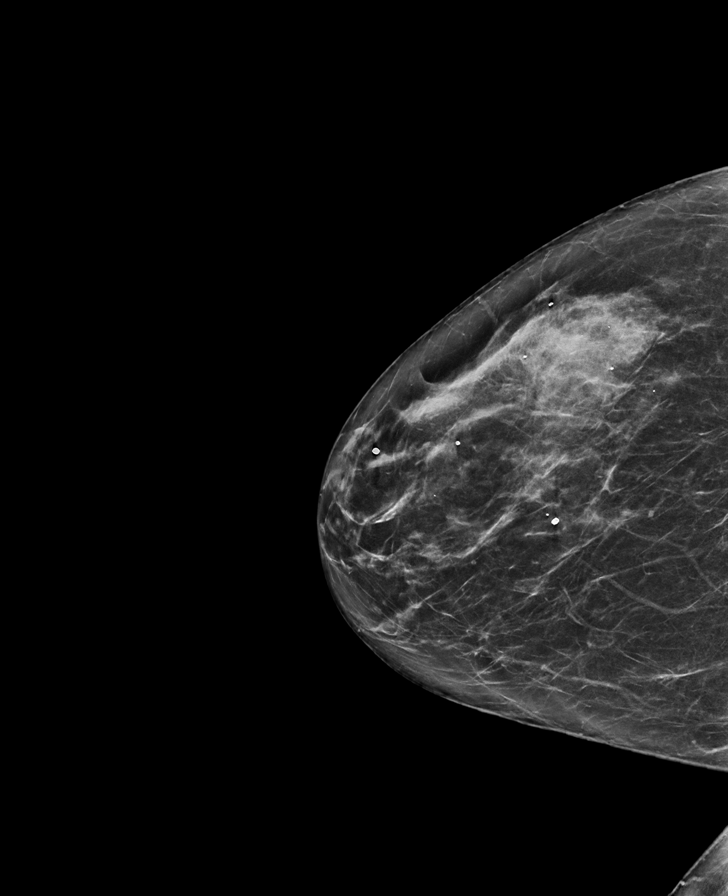

[R MLO tomo · tomo slice 33/66.0]
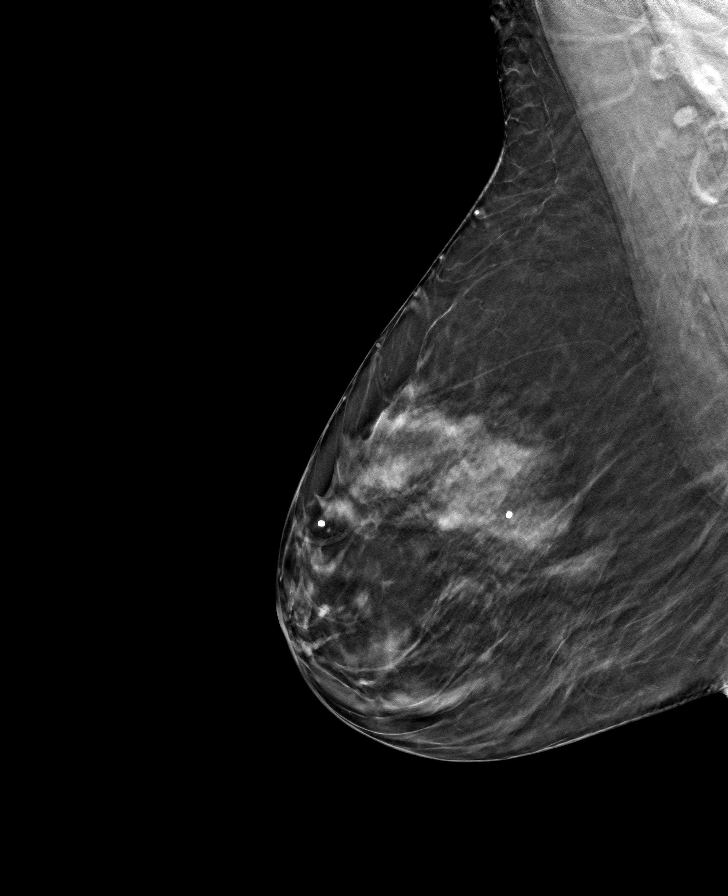

[R CC tomo · tomo slice 35/68.0]
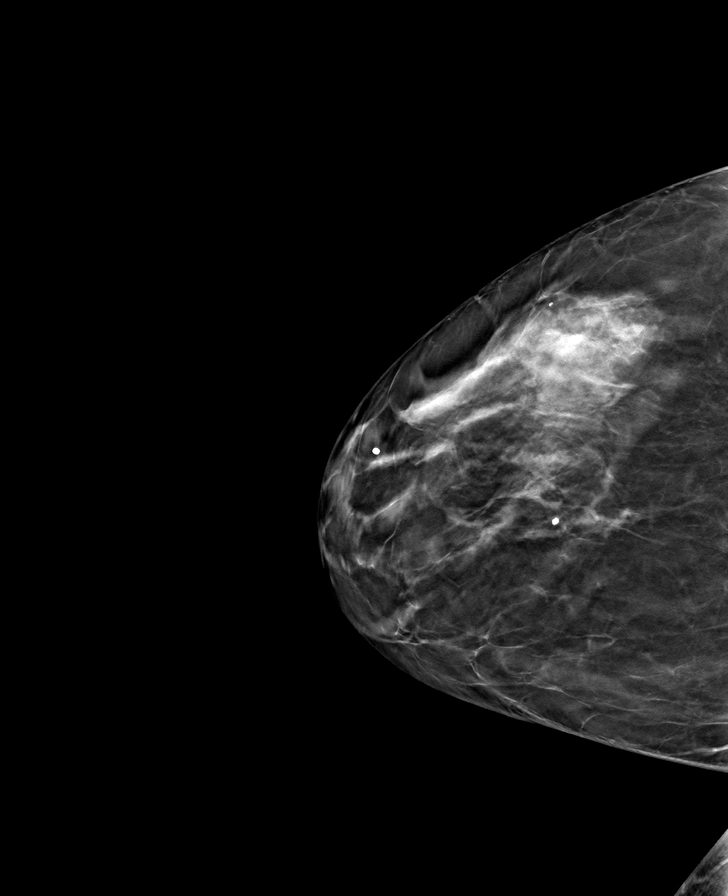

[L MLO tomo · tomo slice 34/67.0]
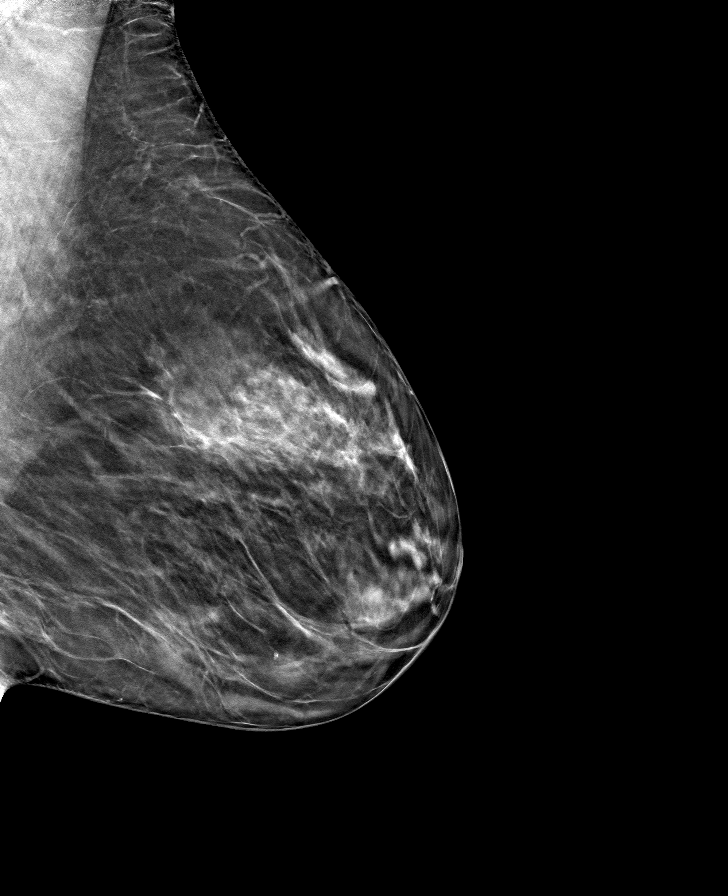

[L CC tomo · tomo slice 35/69.0]
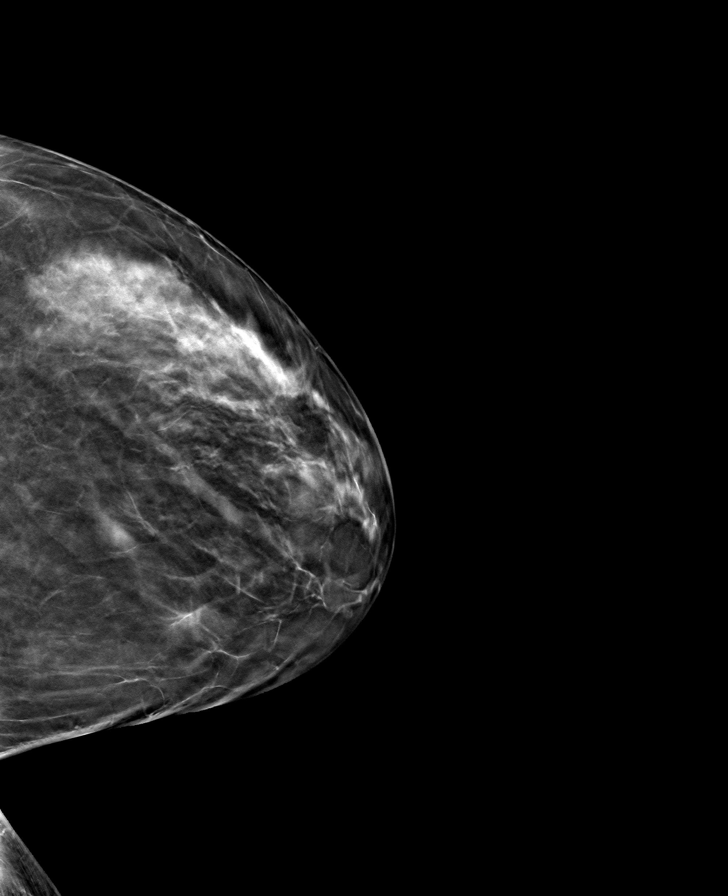

[8 of 24 positions shown; findings below may reference images not displayed]

ACR Breast Density Category c: The breast tissue is heterogeneously
dense, which may obscure small masses.
FINDINGS: There are no findings suspicious for malignancy. Images were
processed with CAD.
IMPRESSION: No mammographic evidence of malignancy. A result letter of this
screening mammogram will be mailed directly to the patient.

RECOMMENDATION:
Screening mammogram in one year. (Code:[id number])

BI-RADS CATEGORY  1: Negative.

## 2020-01-11 ENCOUNTER — Telehealth: Payer: Self-pay | Admitting: Internal Medicine

## 2020-01-11 NOTE — Telephone Encounter (Signed)
Left message for patient to call back and schedule Medicare Annual Wellness Visit (AWV) either virtually or audio only.  Last AWV 04/24/17; please schedule at anytime with Denisa O'Brien-Blaney at [facility].

## 2020-01-21 DIAGNOSIS — D2271 Melanocytic nevi of right lower limb, including hip: Secondary | ICD-10-CM | POA: Diagnosis not present

## 2020-01-21 DIAGNOSIS — D2262 Melanocytic nevi of left upper limb, including shoulder: Secondary | ICD-10-CM | POA: Diagnosis not present

## 2020-01-21 DIAGNOSIS — D225 Melanocytic nevi of trunk: Secondary | ICD-10-CM | POA: Diagnosis not present

## 2020-01-21 DIAGNOSIS — D2272 Melanocytic nevi of left lower limb, including hip: Secondary | ICD-10-CM | POA: Diagnosis not present

## 2020-01-21 DIAGNOSIS — D2261 Melanocytic nevi of right upper limb, including shoulder: Secondary | ICD-10-CM | POA: Diagnosis not present

## 2020-01-21 DIAGNOSIS — L821 Other seborrheic keratosis: Secondary | ICD-10-CM | POA: Diagnosis not present

## 2020-02-24 ENCOUNTER — Other Ambulatory Visit: Payer: Self-pay | Admitting: Internal Medicine

## 2020-02-26 ENCOUNTER — Other Ambulatory Visit: Payer: Self-pay | Admitting: Internal Medicine

## 2020-02-26 MED ORDER — FLUCONAZOLE 150 MG PO TABS: 150.0000 mg | ORAL_TABLET | Freq: Every day | ORAL | 0 refills | Status: DC

## 2020-03-03 ENCOUNTER — Other Ambulatory Visit: Payer: Self-pay | Admitting: Internal Medicine

## 2020-03-03 NOTE — Telephone Encounter (Signed)
Refill request for Kimberly Gill, last seen 08-07-19, last filled 08-03-19.  Please advise.

## 2020-03-11 ENCOUNTER — Encounter: Payer: Self-pay | Admitting: Internal Medicine

## 2020-03-11 ENCOUNTER — Other Ambulatory Visit: Payer: Self-pay

## 2020-03-11 ENCOUNTER — Ambulatory Visit (INDEPENDENT_AMBULATORY_CARE_PROVIDER_SITE_OTHER): Payer: Medicare Other | Admitting: Internal Medicine

## 2020-03-11 DIAGNOSIS — F5101 Primary insomnia: Secondary | ICD-10-CM

## 2020-03-11 DIAGNOSIS — Z7189 Other specified counseling: Secondary | ICD-10-CM | POA: Diagnosis not present

## 2020-03-11 MED ORDER — ZOLPIDEM TARTRATE 10 MG PO TABS: 10.0000 mg | ORAL_TABLET | Freq: Every evening | ORAL | 5 refills | Status: DC | PRN

## 2020-03-11 NOTE — Patient Instructions (Addendum)
Good to see you!   I have refilled your Lorrin Mais for another 6 months    Your bone density  test has been ordered.  You are encouraged t o call to make your appointment at [facility]   See you in 6 months for your annual exam

## 2020-03-11 NOTE — Progress Notes (Signed)
Subjective:  Patient ID: Kimberly Gill, female    DOB: 1951/06/23  Age: 69 y.o. MRN: [id number]  CC: Diagnoses of Educated about COVID-19 virus infection and Primary insomnia were pertinent to this visit.  HPI Kimberly Gill presents for follow up on insomnia, managed with zolpidem.  This visit occurred during the SARS-CoV-2 public health emergency.  Safety protocols were in place, including screening questions prior to the visit, additional usage of staff PPE, and extensive cleaning of exam room while observing appropriate contact time as indicated for disinfecting solutions.  '  Patient has received the  COVID 19 vaccine without complications.  Patient continues to mask when outside of the home except when walking in yard or at safe distances from others .  Patient denies any change in mood or development of unhealthy behaviors resuting from the pandemic's restriction of activities and socialization.    She feels generally well.  Recently turned 29,  Which has taken some adjusting to emotionally, but has finally embraced her age.    Her insomnia is Chronic, with no improvement using over-the-counter first generation antihistamines. Reviewed principles of good sleep hygiene. Although she is a snorer, there is no report of apneic spells by husband. She is using [place] sparingly,  Half a tablet at most,  Doesn't use it every night.   Participates in yoga classes  3 times per week and walks most days for 30 to 45 minutes.  Denies joint pain .  No changes in bowel or bladder function.  Has some anxiety concerning her son's decision to defer COVID vaccination.   Outpatient Medications Prior to Visit  Medication Sig Dispense Refill  . calcium citrate-vitamin D (CITRACAL+D) 315-200 MG-UNIT per tablet Take 1 tablet by mouth 2 (two) times daily.    . Multiple Vitamin (MULTIVITAMIN) tablet Take 1 tablet by mouth daily.    Marland Kitchen zolpidem (AMBIEN) 10 MG tablet TAKE 1 TABLET (10 MG TOTAL) BY  MOUTH AT BEDTIME AS NEEDED. 30 tablet 0  . acetic acid 2 % otic solution Place 4 drops into the right ear every 3 (three) hours. (Patient not taking: Reported on 03/11/2020) 15 mL 0  . fluconazole (DIFLUCAN) 150 MG tablet Take 1 tablet (150 mg total) by mouth daily. (Patient not taking: Reported on 03/11/2020) 2 tablet 0   No facility-administered medications prior to visit.    Review of Systems;  Patient denies headache, fevers, malaise, unintentional weight loss, skin rash, eye pain, sinus congestion and sinus pain, sore throat, dysphagia,  hemoptysis , cough, dyspnea, wheezing, chest pain, palpitations, orthopnea, edema, abdominal pain, nausea, melena, diarrhea, constipation, flank pain, dysuria, hematuria, urinary  Frequency, nocturia, numbness, tingling, seizures,  Focal weakness, Loss of consciousness,  Tremor, insomnia, depression, anxiety, and suicidal ideation.      Objective:  BP 128/80 (BP Location: Left Arm, Patient Position: Sitting, Cuff Size: Normal)   Pulse 77   Temp 98.2 F (36.8 C) (Oral)   Resp 14   Ht 5\' 5"  (1.651 m)   Wt 140 lb (63.5 kg)   SpO2 97%   BMI 23.30 kg/m   BP Readings from Last 3 Encounters:  03/11/20 128/80  06/10/19 128/88  05/30/18 122/62    Wt Readings from Last 3 Encounters:  03/11/20 140 lb (63.5 kg)  08/07/19 145 lb (65.8 kg)  06/10/19 145 lb 12.8 oz (66.1 kg)    General appearance: alert, cooperative and appears stated age Ears: normal TM's and external ear canals both ears Throat: lips, mucosa,  and tongue normal; teeth and gums normal Neck: no adenopathy, no carotid bruit, supple, symmetrical, trachea midline and thyroid not enlarged, symmetric, no tenderness/mass/nodules Back: symmetric, no curvature. ROM normal. No CVA tenderness. Lungs: clear to auscultation bilaterally Heart: regular rate and rhythm, S1, S2 normal, no murmur, click, rub or gallop Abdomen: soft, non-tender; bowel sounds normal; no masses,  no organomegaly Pulses:  2+ and symmetric Skin: Skin color, texture, turgor normal. No rashes or lesions Lymph nodes: Cervical, supraclavicular, and axillary nodes normal.  No results found for: HGBA1C  Lab Results  Component Value Date   CREATININE 0.82 06/10/2019   CREATININE 0.78 05/30/2018   CREATININE 0.77 04/24/2017    Lab Results  Component Value Date   WBC 7.5 06/10/2019   HGB 13.3 06/10/2019   HCT 40.1 06/10/2019   PLT 239.0 06/10/2019   GLUCOSE 87 06/10/2019   CHOL 239 (H) 06/10/2019   TRIG 60.0 06/10/2019   HDL 98.90 06/10/2019   LDLDIRECT 113 04/19/2016   LDLCALC 128 (H) 06/10/2019   ALT 16 06/10/2019   AST 24 06/10/2019   NA 138 06/10/2019   K 4.0 06/10/2019   CL 100 06/10/2019   CREATININE 0.82 06/10/2019   BUN 10 06/10/2019   CO2 28 06/10/2019   TSH 3.33 06/10/2019    MM 3D SCREEN BREAST BILATERAL  Result Date: 12/02/2019 CLINICAL DATA:  Screening. EXAM: DIGITAL SCREENING BILATERAL MAMMOGRAM WITH TOMO AND CAD COMPARISON:  Previous exam(s). ACR Breast Density Category c: The breast tissue is heterogeneously dense, which may obscure small masses. FINDINGS: There are no findings suspicious for malignancy. Images were processed with CAD. IMPRESSION: No mammographic evidence of malignancy. A result letter of this screening mammogram will be mailed directly to the patient. RECOMMENDATION: Screening mammogram in one year. (Code:[id number]) BI-RADS CATEGORY  1: Negative. Electronically Signed   By: Everlean Alstrom M.D.   On: 12/02/2019 13:52    Assessment & Plan:   Problem List Items Addressed This Visit      Unprioritized   Educated about COVID-19 virus infection    Patient was screened for the development of any unsafe behaviors or habits that may have developed as a result of the social impact of the virus , including alcohol abuse,  Domestic violence, tobacco abuse and overeating.  Discussed the need for continued vigilance about mild symptoms that she may develop in spite of  receiving the vaccination  and advised her to get tested if they develop.      Insomnia    She has tried the natural remedies for insomnia including herbal tea and melatonin, and adheres to the principles of good sleep hygiene.  Trial of lunesta was also not helpful.  I have reviewed the dangers of prescribing this drug to the elderly with patient.  She prefers to continue using [place]  .  Refills given          I have discontinued Rylah C. Melkonian's acetic acid and fluconazole. I am also having her maintain her multivitamin, calcium citrate-vitamin D, and zolpidem.  Meds ordered this encounter  Medications  . zolpidem (AMBIEN) 10 MG tablet    Sig: Take 1 tablet (10 mg total) by mouth at bedtime as needed.    Dispense:  30 tablet    Refill:  5    KEEP ON FILE FOR FUTURE REFILLS    Medications Discontinued During This Encounter  Medication Reason  . acetic acid 2 % otic solution   . fluconazole (DIFLUCAN) 150 MG tablet Completed  Course  . zolpidem (AMBIEN) 10 MG tablet Reorder    Follow-up: No follow-ups on file.   Crecencio Mc, MD

## 2020-03-13 NOTE — Assessment & Plan Note (Signed)
She has tried the natural remedies for insomnia including herbal tea and melatonin, and adheres to the principles of good sleep hygiene.  Trial of lunesta was also not helpful.  I have reviewed the dangers of prescribing this drug to the elderly with patient.  She prefers to continue using [place]  .  Refills given

## 2020-03-13 NOTE — Assessment & Plan Note (Signed)
Patient was screened for the development of any unsafe behaviors or habits that may have developed as a result of the social impact of the virus , including alcohol abuse,  Domestic violence, tobacco abuse and overeating.  Discussed the need for continued vigilance about mild symptoms that she may develop in spite of receiving the vaccination  and advised her to get tested if they develop.

## 2020-03-15 DIAGNOSIS — H6121 Impacted cerumen, right ear: Secondary | ICD-10-CM | POA: Diagnosis not present

## 2020-04-12 ENCOUNTER — Other Ambulatory Visit: Payer: Self-pay | Admitting: Internal Medicine

## 2020-04-13 NOTE — Telephone Encounter (Signed)
Refill request for Kimberly Gill, last seen 03-11-20, last filled 03-11-20.  Please advise.

## 2020-05-18 DIAGNOSIS — Z23 Encounter for immunization: Secondary | ICD-10-CM | POA: Diagnosis not present

## 2020-05-20 ENCOUNTER — Telehealth: Payer: Self-pay

## 2020-05-20 ENCOUNTER — Ambulatory Visit: Payer: Medicare Other

## 2020-05-20 NOTE — Telephone Encounter (Addendum)
Submitted a prior authorization for Zolpidem Tartrate 10MG  tablets. Prior Authorization Key : [id number]

## 2020-06-16 ENCOUNTER — Encounter: Payer: Medicare Other | Admitting: Internal Medicine

## 2020-06-28 NOTE — Telephone Encounter (Signed)
Approved through 05/20/2021.

## 2020-07-07 DIAGNOSIS — Z23 Encounter for immunization: Secondary | ICD-10-CM | POA: Diagnosis not present

## 2020-08-08 ENCOUNTER — Telehealth: Payer: Self-pay

## 2020-08-08 MED ORDER — FLUCONAZOLE 150 MG PO TABS: 150.0000 mg | ORAL_TABLET | Freq: Every day | ORAL | 0 refills | Status: DC

## 2020-08-08 NOTE — Telephone Encounter (Signed)
Spoke with pt to let her know that rx has been sent in.

## 2020-08-08 NOTE — Telephone Encounter (Signed)
Nystatin swish and swallow sent to pharmacy

## 2020-08-08 NOTE — Telephone Encounter (Signed)
Pt requested Diflucan due to symptoms of a yeast infection for five plus days. Home remedies have not worked. Itching, burning in vaginal area. Please call pt to advise. No appts avail at time of call

## 2020-08-15 ENCOUNTER — Telehealth: Payer: Self-pay | Admitting: Internal Medicine

## 2020-08-15 NOTE — Telephone Encounter (Signed)
Left message for patient to call back and schedule Medicare Annual Wellness Visit (AWV)   This should be a telephone visit only=30 minutes.  Last AWV 04/24/17; please schedule at anytime with Denisa O'Brien-Blaney at [facility].

## 2020-09-14 ENCOUNTER — Encounter: Payer: Self-pay | Admitting: *Deleted

## 2020-09-16 ENCOUNTER — Encounter: Payer: Medicare Other | Admitting: Internal Medicine

## 2020-09-29 ENCOUNTER — Ambulatory Visit (INDEPENDENT_AMBULATORY_CARE_PROVIDER_SITE_OTHER): Payer: Medicare Other | Admitting: Internal Medicine

## 2020-09-29 ENCOUNTER — Other Ambulatory Visit: Payer: Self-pay

## 2020-09-29 ENCOUNTER — Encounter: Payer: Self-pay | Admitting: Internal Medicine

## 2020-09-29 VITALS — BP 146/94 | HR 76 | Temp 97.4°F | Ht 65.0 in | Wt 141.8 lb

## 2020-09-29 DIAGNOSIS — Z780 Asymptomatic menopausal state: Secondary | ICD-10-CM

## 2020-09-29 DIAGNOSIS — E782 Mixed hyperlipidemia: Secondary | ICD-10-CM

## 2020-09-29 DIAGNOSIS — R5383 Other fatigue: Secondary | ICD-10-CM | POA: Diagnosis not present

## 2020-09-29 DIAGNOSIS — F5101 Primary insomnia: Secondary | ICD-10-CM

## 2020-09-29 DIAGNOSIS — T50B95A Adverse effect of other viral vaccines, initial encounter: Secondary | ICD-10-CM

## 2020-09-29 DIAGNOSIS — R1011 Right upper quadrant pain: Secondary | ICD-10-CM

## 2020-09-29 LAB — LIPID PANEL
Cholesterol: 259 mg/dL — ABNORMAL HIGH (ref 0–200)
HDL: 108.4 mg/dL (ref >39.00)
LDL Cholesterol: 139 mg/dL — ABNORMAL HIGH (ref 0–99)
NonHDL: 151.03
Total CHOL/HDL Ratio: 2
Triglycerides: 58 mg/dL (ref 0.0–149.0)
VLDL: 11.6 mg/dL (ref 0.0–40.0)

## 2020-09-29 LAB — CBC WITH DIFFERENTIAL/PLATELET
Basophils Absolute: 0.1 10*3/uL (ref 0.0–0.1)
Basophils Relative: 1 % (ref 0.0–3.0)
Eosinophils Absolute: 0.1 10*3/uL (ref 0.0–0.7)
Eosinophils Relative: 0.9 % (ref 0.0–5.0)
HCT: 40.8 % (ref 36.0–46.0)
Hemoglobin: 13.7 g/dL (ref 12.0–15.0)
Lymphocytes Relative: 30.4 % (ref 12.0–46.0)
Lymphs Abs: 1.8 10*3/uL (ref 0.7–4.0)
MCHC: 33.6 g/dL (ref 30.0–36.0)
MCV: 92.2 fl (ref 78.0–100.0)
Monocytes Absolute: 0.4 10*3/uL (ref 0.1–1.0)
Monocytes Relative: 7.7 % (ref 3.0–12.0)
Neutro Abs: 3.5 10*3/uL (ref 1.4–7.7)
Neutrophils Relative %: 60 % (ref 43.0–77.0)
Platelets: 248 10*3/uL (ref 150.0–400.0)
RBC: 4.42 Mil/uL (ref 3.87–5.11)
RDW: 14.3 % (ref 11.5–15.5)
WBC: 5.8 10*3/uL (ref 4.0–10.5)

## 2020-09-29 LAB — COMPREHENSIVE METABOLIC PANEL
ALT: 13 U/L (ref 0–35)
AST: 20 U/L (ref 0–37)
Albumin: 4.5 g/dL (ref 3.5–5.2)
Alkaline Phosphatase: 57 U/L (ref 39–117)
BUN: 13 mg/dL (ref 6–23)
CO2: 29 mEq/L (ref 19–32)
Calcium: 9.8 mg/dL (ref 8.4–10.5)
Chloride: 101 mEq/L (ref 96–112)
Creatinine, Ser: 0.73 mg/dL (ref 0.40–1.20)
GFR: 83.19 mL/min (ref >60.00)
Glucose, Bld: 83 mg/dL (ref 70–99)
Potassium: 4.5 mEq/L (ref 3.5–5.1)
Sodium: 138 mEq/L (ref 135–145)
Total Bilirubin: 0.8 mg/dL (ref 0.2–1.2)
Total Protein: 7.3 g/dL (ref 6.0–8.3)

## 2020-09-29 LAB — TSH: TSH: 2.81 u[IU]/mL (ref 0.35–4.50)

## 2020-09-29 MED ORDER — ZOLPIDEM TARTRATE 10 MG PO TABS: 10.0000 mg | ORAL_TABLET | Freq: Every evening | ORAL | 5 refills | Status: DC | PRN

## 2020-09-29 MED ORDER — ZOSTER VAC RECOMB ADJUVANTED 50 MCG/0.5ML IM SUSR: 0.5000 mL | Freq: Once | INTRAMUSCULAR | 1 refills | Status: AC

## 2020-09-29 NOTE — Progress Notes (Signed)
Patient ID: Kimberly Gill, female    DOB: 1951-07-20  Age: 70 y.o. MRN: [id number]  The patient is here for FOLLOW UP AND  management of other chronic and acute problems.   The risk factors are reviewed and reflected in the social history.  The roster of all physicians providing medical care to patient - is listed in the Snapshot section of the chart.  Activities of daily living:  The patient is 100% independent in all ADLs: dressing, toileting, feeding as well as independent mobility  Home safety : The patient has smoke detectors in the home. They wear seatbelts.  There are no firearms at home. There is no violence in the home.   There is no risks for hepatitis, STDs or HIV. There is no   history of blood transfusion. They have no travel history to infectious disease endemic areas of the world.  The patient has seen their dentist in the last six month. They have seen their eye doctor in the last year. They admit to slight hearing difficulty with regard to whispered voices and some television programs.  They have deferred audiologic testing in the last year.  They do not  have excessive sun exposure. Discussed the need for sun protection: hats, long sleeves and use of sunscreen if there is significant sun exposure.   Diet: the importance of a healthy diet is discussed. They do have a healthy diet.  The benefits of regular aerobic exercise were discussed. She walks 4 times per week ,  20 minutes.   Depression screen: there are no signs or vegative symptoms of depression- irritability, change in appetite, anhedonia, sadness/tearfullness.  Cognitive assessment: the patient manages all their financial and personal affairs and is actively engaged. They could relate day,date,year and events; recalled 2/3 objects at 3 minutes; performed clock-face test normally.  The following portions of the patient's history were reviewed and updated as appropriate: allergies, current medications, past family  history, past medical history,  past surgical history, past social history  and problem list.  Visual acuity was not assessed per patient preference since she has regular follow up with her ophthalmologist. Hearing and body mass index were assessed and reviewed.   During the course of the visit the patient was educated and counseled about appropriate screening and preventive services including : fall prevention , diabetes screening, nutrition counseling, colorectal cancer screening, and recommended immunizations.    CC: The primary encounter diagnosis was RUQ pain. Diagnoses of Fatigue after COVID-19 vaccination, Moderate mixed hyperlipidemia not requiring statin therapy, Postmenopausal estrogen deficiency, Primary insomnia, Postprandial RUQ pain, and Fatigue, unspecified type were also pertinent to this visit.  1) anxiety:  The patient feels generally well,  And  denies any feelings of hopelessness or anhedonia.  No panic attacks.  Patient denies any active or passive suicidal thoughts or psychosis.  Appetite is good.  Denies any marital conflict or other stressors contributing to anxiety .  Continues to have insomnia managed with ambien.    2) recurrent episodes of RUQ pain that occurred post prandially during X mas season  Felt like gas, was accompanied by nausea that lasted 3 weeks and intermittent tea colored urine .  Episodes would last about 20 minutes,  Had 3 or 4 episodes of the ruq pain that resolved spontaneously.  Tried using heat ,  pepto bismol .  No unintentional weight loss.  Some fatigue   History Kimberly Gill has no past medical history on file.   She has a past  surgical history that includes Bunionectomy and Breast biopsy (Left, 09/09/2009).   Her family history includes Alcohol abuse in her sister; Congestive Heart Failure in her father and mother; Hypertension in her father, mother, and sister; Mental illness in her sister; Mental retardation in her father; Osteoporosis in her  mother.She reports that she has never smoked. She has never used smokeless tobacco. She reports current alcohol use. She reports that she does not use drugs.  Outpatient Medications Prior to Visit  Medication Sig Dispense Refill  . calcium citrate-vitamin D (CITRACAL+D) 315-200 MG-UNIT per tablet Take 1 tablet by mouth 2 (two) times daily.    . Multiple Vitamin (MULTIVITAMIN) tablet Take 1 tablet by mouth daily.    Marland Kitchen zolpidem (AMBIEN) 10 MG tablet TAKE 1 TABLET (10 MG TOTAL) BY MOUTH AT BEDTIME AS NEEDED. 30 tablet 5  . fluconazole (DIFLUCAN) 150 MG tablet Take 1 tablet (150 mg total) by mouth daily. (Patient not taking: Reported on 09/29/2020) 2 tablet 0   No facility-administered medications prior to visit.    Review of Systems  Patient denies headache, fevers, malaise, unintentional weight loss, skin rash, eye pain, sinus congestion and sinus pain, sore throat, dysphagia,  hemoptysis , cough, dyspnea, wheezing, chest pain, palpitations, orthopnea, edema, melena, diarrhea, constipation, flank pain, dysuria, hematuria, urinary  Frequency, nocturia, numbness, tingling, seizures,  Focal weakness, Loss of consciousness,  Tremor, insomnia, depression, anxiety, and suicidal ideation.     Objective:  BP (!) 146/94 (BP Location: Left Arm, Patient Position: Sitting)   Pulse 76   Temp (!) 97.4 F (36.3 C)   Ht 5\' 5"  (1.651 m)   Wt 141 lb 12.8 oz (64.3 kg)   SpO2 99%   BMI 23.60 kg/m   Physical Exam  General appearance: alert, cooperative and appears stated age Head: Normocephalic, without obvious abnormality, atraumatic Eyes: conjunctivae/corneas clear. PERRL, EOM's intact. Fundi benign. Ears: normal TM's and external ear canals both ears Nose: Nares normal. Septum midline. Mucosa normal. No drainage or sinus tenderness. Throat: lips, mucosa, and tongue normal; teeth and gums normal Neck: no adenopathy, no carotid bruit, no JVD, supple, symmetrical, trachea midline and thyroid not  enlarged, symmetric, no tenderness/mass/nodules Lungs: clear to auscultation bilaterally Breasts: normal appearance, no masses or tenderness Heart: regular rate and rhythm, S1, S2 normal, no murmur, click, rub or gallop Abdomen: soft, non-tender; bowel sounds normal; no masses,  no organomegaly Extremities: extremities normal, atraumatic, no cyanosis or edema Pulses: 2+ and symmetric Skin: Skin color, texture, turgor normal. No rashes or lesions Neurologic: Alert and oriented X 3, normal strength and tone. Normal symmetric reflexes. Normal coordination and gait.     Assessment & Plan:   Problem List Items Addressed This Visit      Unprioritized   Fatigue    Screening labs normal.  No history of snoring.  Recommended participating in regular exercise program with goal of achieving a minimum of 30 minutes of aerobic activity 5 days per week.    Lab Results  Component Value Date   TSH 2.81 09/29/2020   Lab Results  Component Value Date   WBC 5.8 09/29/2020   HGB 13.7 09/29/2020   HCT 40.8 09/29/2020   MCV 92.2 09/29/2020   PLT 248.0 09/29/2020   Lab Results  Component Value Date   CREATININE 0.73 09/29/2020         Insomnia    She has tried the natural remedies for insomnia including herbal tea and melatonin, and adheres to the principles of good  sleep hygiene.  Trial of lunesta was also not helpful.  I have reviewed the dangers of prescribing ambien to the elderly with patient.  She prefers to continue using [place]  .  Refills given       Postprandial RUQ pain    Recent episodes were accompanied by "tea colored" urine and resolved spontaneously,.  Biliary source suspected,  But liver enzymes are currently normal.  Will check RUA ultrasound  Lab Results  Component Value Date   ALT 13 09/29/2020   AST 20 09/29/2020   ALKPHOS 57 09/29/2020   BILITOT 0.8 09/29/2020   No results found for: LIPASE        Other Visit Diagnoses    RUQ pain    -  Primary   Relevant  Orders   US Abdomen Limited RUQ (LIVER/GB)   Comprehensive metabolic panel (Completed)   Fatigue after COVID-19 vaccination       Relevant Orders   TSH (Completed)   SARS-CoV-2 Semi-Quantitative Total Antibody, Spike   CBC with Differential/Platelet (Completed)   Moderate mixed hyperlipidemia not requiring statin therapy       Relevant Orders   Lipid panel (Completed)   Postmenopausal estrogen deficiency       Relevant Orders   DG Bone Density      I have discontinued Kimberly Gill's fluconazole. I am also having her start on Zoster Vaccine Adjuvanted. Additionally, I am having her maintain her multivitamin, calcium citrate-vitamin D, and zolpidem.  Meds ordered this encounter  Medications  . zolpidem (AMBIEN) 10 MG tablet    Sig: Take 1 tablet (10 mg total) by mouth at bedtime as needed.    Dispense:  30 tablet    Refill:  5    Not to exceed 5 additional fills before 08/30/2020  . Zoster Vaccine Adjuvanted [facility]) injection    Sig: Inject 0.5 mLs into the muscle once for 1 dose.    Dispense:  1 each    Refill:  1    Medications Discontinued During This Encounter  Medication Reason  . fluconazole (DIFLUCAN) 150 MG tablet Error  . zolpidem (AMBIEN) 10 MG tablet Reorder   I provided  30 minutes of  face-to-face time during this encounter reviewing patient's current problems and past surgeries, labs and imaging studies, providing counseling on the above mentioned problems , and coordination  of care .  Follow-up: No follow-ups on file.   Crecencio Mc, MD

## 2020-09-29 NOTE — Patient Instructions (Addendum)
Ultrasound of liver ordered  Your annual mammogram s due in April has been ordered.  You are encouraged (required) to call to make your appointment at [facility]  DEXA scan ordered as well  Follow up one month for BP check     Health Maintenance After Age 71 After age 75, you are at a higher risk for certain long-term diseases and infections as well as injuries from falls. Falls are a major cause of broken bones and head injuries in people who are older than age 64. Getting regular preventive care can help to keep you healthy and well. Preventive care includes getting regular testing and making lifestyle changes as recommended by your health care provider. Talk with your health care provider about:  Which screenings and tests you should have. A screening is a test that checks for a disease when you have no symptoms.  A diet and exercise plan that is right for you. What should I know about screenings and tests to prevent falls? Screening and testing are the best ways to find a health problem early. Early diagnosis and treatment give you the best chance of managing medical conditions that are common after age 57. Certain conditions and lifestyle choices may make you more likely to have a fall. Your health care provider may recommend:  Regular vision checks. Poor vision and conditions such as cataracts can make you more likely to have a fall. If you wear glasses, make sure to get your prescription updated if your vision changes.  Medicine review. Work with your health care provider to regularly review all of the medicines you are taking, including over-the-counter medicines. Ask your health care provider about any side effects that may make you more likely to have a fall. Tell your health care provider if any medicines that you take make you feel dizzy or sleepy.  Osteoporosis screening. Osteoporosis is a condition that causes the bones to get weaker. This can make the bones weak and  cause them to break more easily.  Blood pressure screening. Blood pressure changes and medicines to control blood pressure can make you feel dizzy.  Strength and balance checks. Your health care provider may recommend certain tests to check your strength and balance while standing, walking, or changing positions.  Foot health exam. Foot pain and numbness, as well as not wearing proper footwear, can make you more likely to have a fall.  Depression screening. You may be more likely to have a fall if you have a fear of falling, feel emotionally low, or feel unable to do activities that you used to do.  Alcohol use screening. Using too much alcohol can affect your balance and may make you more likely to have a fall. What actions can I take to lower my risk of falls? General instructions  Talk with your health care provider about your risks for falling. Tell your health care provider if: ? You fall. Be sure to tell your health care provider about all falls, even ones that seem minor. ? You feel dizzy, sleepy, or off-balance.  Take over-the-counter and prescription medicines only as told by your health care provider. These include any supplements.  Eat a healthy diet and maintain a healthy weight. A healthy diet includes low-fat dairy products, low-fat (lean) meats, and fiber from whole grains, beans, and lots of fruits and vegetables. Home safety  Remove any tripping hazards, such as rugs, cords, and clutter.  Install safety equipment such as grab bars in bathrooms  and safety rails on stairs.  Keep rooms and walkways well-lit. Activity  Follow a regular exercise program to stay fit. This will help you maintain your balance. Ask your health care provider what types of exercise are appropriate for you.  If you need a cane or walker, use it as recommended by your health care provider.  Wear supportive shoes that have nonskid soles.   Lifestyle  Do not drink alcohol if your health care  provider tells you not to drink.  If you drink alcohol, limit how much you have: ? 0-1 drink a day for women. ? 0-2 drinks a day for men.  Be aware of how much alcohol is in your drink. In the [place] one drink equals one typical bottle of beer (12 oz), one-half glass of wine (5 oz), or one shot of hard liquor (1 oz).  Do not use any products that contain nicotine or tobacco, such as cigarettes and e-cigarettes. If you need help quitting, ask your health care provider. Summary  Having a healthy lifestyle and getting preventive care can help to protect your health and wellness after age 2.  Screening and testing are the best way to find a health problem early and help you avoid having a fall. Early diagnosis and treatment give you the best chance for managing medical conditions that are more common for people who are older than age 39.  Falls are a major cause of broken bones and head injuries in people who are older than age 74. Take precautions to prevent a fall at home.  Work with your health care provider to learn what changes you can make to improve your health and wellness and to prevent falls. This information is not intended to replace advice given to you by your health care provider. Make sure you discuss any questions you have with your health care provider. Document Revised: 11/27/2018 Document Reviewed: 06/19/2017 Elsevier Patient Education  2021 [employer].

## 2020-10-01 ENCOUNTER — Encounter: Payer: Self-pay | Admitting: Internal Medicine

## 2020-10-01 DIAGNOSIS — R1011 Right upper quadrant pain: Secondary | ICD-10-CM | POA: Insufficient documentation

## 2020-10-01 DIAGNOSIS — F4321 Adjustment disorder with depressed mood: Secondary | ICD-10-CM | POA: Insufficient documentation

## 2020-10-01 DIAGNOSIS — R5383 Other fatigue: Secondary | ICD-10-CM | POA: Insufficient documentation

## 2020-10-01 NOTE — Assessment & Plan Note (Signed)
Recent episodes were accompanied by "tea colored" urine and resolved spontaneously,.  Biliary source suspected,  But liver enzymes are currently normal.  Will check RUA ultrasound  Lab Results  Component Value Date   ALT 13 09/29/2020   AST 20 09/29/2020   ALKPHOS 57 09/29/2020   BILITOT 0.8 09/29/2020   No results found for: LIPASE

## 2020-10-01 NOTE — Assessment & Plan Note (Signed)
Screening labs normal.  No history of snoring.  Recommended participating in regular exercise program with goal of achieving a minimum of 30 minutes of aerobic activity 5 days per week.    Lab Results  Component Value Date   TSH 2.81 09/29/2020   Lab Results  Component Value Date   WBC 5.8 09/29/2020   HGB 13.7 09/29/2020   HCT 40.8 09/29/2020   MCV 92.2 09/29/2020   PLT 248.0 09/29/2020   Lab Results  Component Value Date   CREATININE 0.73 09/29/2020

## 2020-10-01 NOTE — Assessment & Plan Note (Signed)
She has tried the natural remedies for insomnia including herbal tea and melatonin, and adheres to the principles of good sleep hygiene.  Trial of lunesta was also not helpful.  I have reviewed the dangers of prescribing ambien to the elderly with patient.  She prefers to continue using ambien  .  Refills given  

## 2020-10-03 ENCOUNTER — Encounter: Payer: Self-pay | Admitting: Internal Medicine

## 2020-10-04 LAB — SARS-COV-2 SEMI-QUANTITATIVE TOTAL ANTIBODY, SPIKE: SARS COV2 AB, Total Spike Semi QN: 1871 U/mL — ABNORMAL HIGH (ref <0.8)

## 2020-10-06 ENCOUNTER — Ambulatory Visit
Admission: RE | Admit: 2020-10-06 | Discharge: 2020-10-06 | Disposition: A | Discharge status: Home / Self Care | Payer: Medicare Other | Source: Ambulatory Visit | Attending: Internal Medicine | Admitting: Internal Medicine

## 2020-10-06 ENCOUNTER — Other Ambulatory Visit: Payer: Self-pay

## 2020-10-06 DIAGNOSIS — R1011 Right upper quadrant pain: Secondary | ICD-10-CM | POA: Diagnosis not present

## 2020-10-26 ENCOUNTER — Other Ambulatory Visit: Payer: Self-pay

## 2020-10-27 ENCOUNTER — Telehealth: Payer: Self-pay | Admitting: Internal Medicine

## 2020-10-27 ENCOUNTER — Ambulatory Visit: Payer: Medicare Other

## 2020-10-27 NOTE — Telephone Encounter (Signed)
Left message for patient to call back and schedule Medicare Annual Wellness Visit (AWV)   This should be a virtual visit only=30 minutes.  Last AWV 04/24/17; please schedule at anytime with Denisa O'Brien-Blaney at [facility].

## 2020-10-31 ENCOUNTER — Ambulatory Visit (INDEPENDENT_AMBULATORY_CARE_PROVIDER_SITE_OTHER): Payer: Medicare Other | Admitting: *Deleted

## 2020-10-31 ENCOUNTER — Other Ambulatory Visit: Payer: Self-pay

## 2020-10-31 VITALS — BP 144/84 | HR 64 | Temp 98.2°F | Resp 14

## 2020-10-31 DIAGNOSIS — R030 Elevated blood-pressure reading, without diagnosis of hypertension: Secondary | ICD-10-CM | POA: Diagnosis not present

## 2020-10-31 DIAGNOSIS — I10 Essential (primary) hypertension: Secondary | ICD-10-CM | POA: Insufficient documentation

## 2020-10-31 NOTE — Progress Notes (Addendum)
Patient here for nurse visit BP check per order from 09/29/20 Office visit  Patient reports compliance with prescribed BP medications: Patient currently on No BP medications  Last dose of BP medication:   BP Readings from Last 3 Encounters:  10/31/20 138/84  09/29/20 (!) 146/94  03/11/20 128/80   Pulse Readings from Last 3 Encounters:  10/31/20 73  09/29/20 76  03/11/20 77   BP remains above goal.  Recommend starting amlodipine 2.5 mg daily  rx sent to pharmacy.   Patient verbalized understanding of instructions.   Kerin Salen, RN

## 2020-11-06 MED ORDER — AMLODIPINE BESYLATE 2.5 MG PO TABS: 2.5000 mg | ORAL_TABLET | Freq: Every day | ORAL | 3 refills | Status: DC

## 2020-11-06 NOTE — Addendum Note (Signed)
Addended by: Crecencio Mc on: 11/06/2020 08:18 PM   Modules accepted: Orders, Level of Service

## 2020-11-06 NOTE — Assessment & Plan Note (Signed)
Elevated at RN visit. prescribing amlodipine 2.5 mg daily

## 2020-11-07 NOTE — Progress Notes (Signed)
Left message for patient to call office.  

## 2020-11-07 NOTE — Progress Notes (Signed)
Left message to call office

## 2020-11-10 ENCOUNTER — Encounter: Payer: Self-pay | Admitting: *Deleted

## 2020-11-10 NOTE — Progress Notes (Signed)
Left message to call office third attempt My Chart Message also sent.

## 2020-11-30 ENCOUNTER — Ambulatory Visit: Payer: Medicare Other | Admitting: Internal Medicine

## 2020-12-20 ENCOUNTER — Encounter: Payer: Self-pay | Admitting: Internal Medicine

## 2020-12-20 ENCOUNTER — Other Ambulatory Visit: Payer: Self-pay

## 2020-12-20 ENCOUNTER — Ambulatory Visit (INDEPENDENT_AMBULATORY_CARE_PROVIDER_SITE_OTHER): Payer: Medicare Other | Admitting: Internal Medicine

## 2020-12-20 VITALS — BP 148/84 | HR 78 | Temp 97.3°F | Resp 15 | Ht 65.0 in | Wt 140.8 lb

## 2020-12-20 DIAGNOSIS — I10 Essential (primary) hypertension: Secondary | ICD-10-CM

## 2020-12-20 NOTE — Progress Notes (Signed)
Subjective:  Patient ID: Kimberly Gill, female    DOB: July 23, 1951  Age: 70 y.o. MRN: [id number]  CC: The encounter diagnosis was Essential hypertension.  HPI Kimberly Gill presents for follow up on hypertension  This visit occurred during the SARS-CoV-2 public health emergency.  Safety protocols were in place, including screening questions prior to the visit, additional usage of staff PPE, and extensive cleaning of exam room while observing appropriate contact time as indicated for disinfecting solutions.   She has been  Taking amlodipine 2.5 mg daily for the past 2 weeks after her RN visit for a BP confirmed an elevated reading .  Diet and medications reviewed.  She uses salt regularly to flavor her food  And iuprofen at least every other day to manage joint pain.  She has no history of snoring or sleep apnea.   Outpatient Medications Prior to Visit  Medication Sig Dispense Refill  . amLODipine (NORVASC) 2.5 MG tablet Take 1 tablet (2.5 mg total) by mouth daily. 90 tablet 3  . calcium citrate-vitamin D (CITRACAL+D) 315-200 MG-UNIT per tablet Take 1 tablet by mouth 2 (two) times daily.    . Multiple Vitamin (MULTIVITAMIN) tablet Take 1 tablet by mouth daily.    Marland Kitchen zolpidem (AMBIEN) 10 MG tablet Take 1 tablet (10 mg total) by mouth at bedtime as needed. 30 tablet 5   No facility-administered medications prior to visit.    Review of Systems;  Patient denies headache, fevers, malaise, unintentional weight loss, skin rash, eye pain, sinus congestion and sinus pain, sore throat, dysphagia,  hemoptysis , cough, dyspnea, wheezing, chest pain, palpitations, orthopnea, edema, abdominal pain, nausea, melena, diarrhea, constipation, flank pain, dysuria, hematuria, urinary  Frequency, nocturia, numbness, tingling, seizures,  Focal weakness, Loss of consciousness,  Tremor, insomnia, depression, anxiety, and suicidal ideation.      Objective:  BP (!) 148/84 (BP Location: Left Arm,  Patient Position: Sitting, Cuff Size: Normal)   Pulse 78   Temp (!) 97.3 F (36.3 C) (Temporal)   Resp 15   Ht 5\' 5"  (1.651 m)   Wt 140 lb 12.8 oz (63.9 kg)   SpO2 97%   BMI 23.43 kg/m   BP Readings from Last 3 Encounters:  12/20/20 (!) 148/84  10/31/20 (!) 144/84  09/29/20 (!) 146/94    Wt Readings from Last 3 Encounters:  12/20/20 140 lb 12.8 oz (63.9 kg)  09/29/20 141 lb 12.8 oz (64.3 kg)  03/11/20 140 lb (63.5 kg)    General appearance: alert, cooperative and appears stated age Ears: normal TM's and external ear canals both ears Throat: lips, mucosa, and tongue normal; teeth and gums normal Neck: no adenopathy, no carotid bruit, supple, symmetrical, trachea midline and thyroid not enlarged, symmetric, no tenderness/mass/nodules Back: symmetric, no curvature. ROM normal. No CVA tenderness. Lungs: clear to auscultation bilaterally Heart: regular rate and rhythm, S1, S2 normal, no murmur, click, rub or gallop Abdomen: soft, non-tender; bowel sounds normal; no masses,  no organomegaly Pulses: 2+ and symmetric Skin: Skin color, texture, turgor normal. No rashes or lesions Lymph nodes: Cervical, supraclavicular, and axillary nodes normal.  No results found for: HGBA1C  Lab Results  Component Value Date   CREATININE 0.73 09/29/2020   CREATININE 0.82 06/10/2019   CREATININE 0.78 05/30/2018    Lab Results  Component Value Date   WBC 5.8 09/29/2020   HGB 13.7 09/29/2020   HCT 40.8 09/29/2020   PLT 248.0 09/29/2020   GLUCOSE 83 09/29/2020   CHOL 259 (  H) 09/29/2020   TRIG 58.0 09/29/2020   HDL 108.40 09/29/2020   LDLDIRECT 113 04/19/2016   LDLCALC 139 (H) 09/29/2020   ALT 13 09/29/2020   AST 20 09/29/2020   NA 138 09/29/2020   K 4.5 09/29/2020   CL 101 09/29/2020   CREATININE 0.73 09/29/2020   BUN 13 09/29/2020   CO2 29 09/29/2020   TSH 2.81 09/29/2020   MICROALBUR <0.7 12/20/2020    US Abdomen Limited RUQ (LIVER/GB)  Result Date: 10/07/2020 CLINICAL  DATA:  Postprandial right upper quadrant pain for 1 month EXAM: ULTRASOUND ABDOMEN LIMITED RIGHT UPPER QUADRANT COMPARISON:  CT 04/24/2013 FINDINGS: Gallbladder: No gallstones or wall thickening visualized. No sonographic Murphy sign noted by sonographer. Common bile duct: Diameter: 2.4 mm, nondilated Liver: There are at least 3 small anechoic probable cysts, too within the right lobe liver measuring up to 1.8 cm and 1.2 cm in size. A smaller subcentimeter 0.8 cm focus is present in the left lobe liver as well. There is a fourth small hyperechoic focus measuring 0.9 x 1.1 x 0.9 cm in the right lobe liver (image 54/71). No concerning posterior shadowing or worrisome internal vascularity. Normal hepatic echogenicity. Smooth surface contour. No intrahepatic biliary ductal dilatation. Portal vein is patent on color Doppler imaging with normal direction of blood flow towards the liver. Other: None. IMPRESSION: 1. No evidence of cholelithiasis or acute cholecystitis. 2. At least 3 small anechoic probable cysts in the right and left lobes liver. 3. Additional 1.1 cm hyperechoic focus in the right lobe liver is nonspecific but most likely a small hemangioma in the absence of risk factors, particularly given appearance and stability from comparison CT imaging. Electronically Signed   By: Lovena Le M.D.   On: 10/07/2020 21:04    Assessment & Plan:   Problem List Items Addressed This Visit      Unprioritized   Essential hypertension - Primary    New diagnosis. Not at goal on 2.5 mg amlodipine daily .  Advised to suspend ibuprofen for one week to assess BP without NSAIDs.  If still elevated,  Will increase amlodipine to  5 mg daily.  Screening for proteinuria is negative.       Relevant Orders   Microalbumin / creatinine urine ratio (Completed)      I am having Kimberly Gill maintain her multivitamin, calcium citrate-vitamin D, zolpidem, and amLODipine.  No orders of the defined types were placed in this  encounter.   There are no discontinued medications.  Follow-up: Return in about 3 months (around 03/22/2021).   Crecencio Mc, MD

## 2020-12-20 NOTE — Patient Instructions (Signed)
Ways to lower BP  1) suspend the ibuprofen for a week and follow your BP readings to see if there is a change.  This should be done when nothing else has been changed (meaning you have been on a a stable dose of amlodipine_  2) reduce the salt in your diet .  Goal is 4 grams daily or less (see the DASH diet tips  And " cooking with less salt"  below)  3)  More cardio exercise  4) pending evaluation of your urinalysis,  I would plan to increase the amlodipine dose to 5 mg daily   5) Bring your home machine in for "calibration"  During a RN visit   DASH Eating Plan [place] stands for Dietary Approaches to Stop Hypertension. The DASH eating plan is a healthy eating plan that has been shown to:  Reduce high blood pressure (hypertension).  Reduce your risk for type 2 diabetes, heart disease, and stroke.  Help with weight loss. What are tips for following this plan? Reading food labels  Check food labels for the amount of salt (sodium) per serving. Choose foods with less than 5 percent of the Daily Value of sodium. Generally, foods with less than 300 milligrams (mg) of sodium per serving fit into this eating plan.  To find whole grains, look for the word "whole" as the first word in the ingredient list. Shopping  Buy products labeled as "low-sodium" or "no salt added."  Buy fresh foods. Avoid canned foods and pre-made or frozen meals. Cooking  Avoid adding salt when cooking. Use salt-free seasonings or herbs instead of table salt or sea salt. Check with your health care provider or pharmacist before using salt substitutes.  Do not fry foods. Cook foods using healthy methods such as baking, boiling, grilling, roasting, and broiling instead.  Cook with heart-healthy oils, such as olive, canola, avocado, soybean, or sunflower oil. Meal planning  Eat a balanced diet that includes: ? 4 or more servings of fruits and 4 or more servings of vegetables each day. Try to fill one-half of your  plate with fruits and vegetables. ? 6-8 servings of whole grains each day. ? Less than 6 oz (170 g) of lean meat, poultry, or fish each day. A 3-oz (85-g) serving of meat is about the same size as a deck of cards. One egg equals 1 oz (28 g). ? 2-3 servings of low-fat dairy each day. One serving is 1 cup (237 mL). ? 1 serving of nuts, seeds, or beans 5 times each week. ? 2-3 servings of heart-healthy fats. Healthy fats called omega-3 fatty acids are found in foods such as walnuts, flaxseeds, fortified milks, and eggs. These fats are also found in cold-water fish, such as sardines, salmon, and mackerel.  Limit how much you eat of: ? Canned or prepackaged foods. ? Food that is high in trans fat, such as some fried foods. ? Food that is high in saturated fat, such as fatty meat. ? Desserts and other sweets, sugary drinks, and other foods with added sugar. ? Full-fat dairy products.  Do not salt foods before eating.  Do not eat more than 4 egg yolks a week.  Try to eat at least 2 vegetarian meals a week.  Eat more home-cooked food and less restaurant, buffet, and fast food.   Lifestyle  When eating at a restaurant, ask that your food be prepared with less salt or no salt, if possible.  If you drink alcohol: ? Limit how much  you use to:  0-1 drink a day for women who are not pregnant.  0-2 drinks a day for men. ? Be aware of how much alcohol is in your drink. In the [place] one drink equals one 12 oz bottle of beer (355 mL), one 5 oz glass of wine (148 mL), or one 1 oz glass of hard liquor (44 mL). General information  Avoid eating more than 2,300 mg of salt a day. If you have hypertension, you may need to reduce your sodium intake to 1,500 mg a day.  Work with your health care provider to maintain a healthy body weight or to lose weight. Ask what an ideal weight is for you.  Get at least 30 minutes of exercise that causes your heart to beat faster (aerobic exercise) most days of the  week. Activities may include walking, swimming, or biking.  Work with your health care provider or dietitian to adjust your eating plan to your individual calorie needs. What foods should I eat? Fruits All fresh, dried, or frozen fruit. Canned fruit in natural juice (without added sugar). Vegetables Fresh or frozen vegetables (raw, steamed, roasted, or grilled). Low-sodium or reduced-sodium tomato and vegetable juice. Low-sodium or reduced-sodium tomato sauce and tomato paste. Low-sodium or reduced-sodium canned vegetables. Grains Whole-grain or whole-wheat bread. Whole-grain or whole-wheat pasta. Brown rice. [place]. Bulgur. Whole-grain and low-sodium cereals. Pita bread. Low-fat, low-sodium crackers. Whole-wheat flour tortillas. Meats and other proteins Skinless chicken or [place]. Ground chicken or [place]. Pork with fat trimmed off. Fish and seafood. Egg whites. Dried beans, peas, or lentils. Unsalted nuts, nut butters, and seeds. Unsalted canned beans. Lean cuts of beef with fat trimmed off. Low-sodium, lean precooked or cured meat, such as sausages or meat loaves. Dairy Low-fat (1%) or fat-free (skim) milk. Reduced-fat, low-fat, or fat-free cheeses. Nonfat, low-sodium ricotta or cottage cheese. Low-fat or nonfat yogurt. Low-fat, low-sodium cheese. Fats and oils Soft margarine without trans fats. Vegetable oil. Reduced-fat, low-fat, or light mayonnaise and salad dressings (reduced-sodium). Canola, safflower, olive, avocado, soybean, and sunflower oils. Avocado. Seasonings and condiments Herbs. Spices. Seasoning mixes without salt. Other foods Unsalted popcorn and pretzels. Fat-free sweets. The items listed above may not be a complete list of foods and beverages you can eat. Contact a dietitian for more information. What foods should I avoid? Fruits Canned fruit in a light or heavy syrup. Fried fruit. Fruit in cream or butter sauce. Vegetables Creamed or fried vegetables.  Vegetables in a cheese sauce. Regular canned vegetables (not low-sodium or reduced-sodium). Regular canned tomato sauce and paste (not low-sodium or reduced-sodium). Regular tomato and vegetable juice (not low-sodium or reduced-sodium). Angie Fava. Olives. Grains Baked goods made with fat, such as croissants, muffins, or some breads. Dry pasta or rice meal packs. Meats and other proteins Fatty cuts of meat. Ribs. Fried meat. Berniece Salines. Bologna, salami, and other precooked or cured meats, such as sausages or meat loaves. Fat from the back of a pig (fatback). Bratwurst. Salted nuts and seeds. Canned beans with added salt. Canned or smoked fish. Whole eggs or egg yolks. Chicken or [place] with skin. Dairy Whole or 2% milk, cream, and half-and-half. Whole or full-fat cream cheese. Whole-fat or sweetened yogurt. Full-fat cheese. Nondairy creamers. Whipped toppings. Processed cheese and cheese spreads. Fats and oils Butter. Stick margarine. Lard. Shortening. Ghee. Bacon fat. Tropical oils, such as coconut, palm kernel, or palm oil. Seasonings and condiments Onion salt, garlic salt, seasoned salt, table salt, and sea salt. Worcestershire sauce. Tartar sauce. Barbecue sauce.  Teriyaki sauce. Soy sauce, including reduced-sodium. Steak sauce. Canned and packaged gravies. Fish sauce. Oyster sauce. Cocktail sauce. Store-bought horseradish. Ketchup. Mustard. Meat flavorings and tenderizers. Bouillon cubes. Hot sauces. Pre-made or packaged marinades. Pre-made or packaged taco seasonings. Relishes. Regular salad dressings. Other foods Salted popcorn and pretzels. The items listed above may not be a complete list of foods and beverages you should avoid. Contact a dietitian for more information. Where to find more information  National Heart, Lung, and Blood Institute: https://wilson-eaton.com/  American Heart Association: www.heart.org  Academy of Nutrition and Dietetics: www.eatright.[place]:  www.kidney.org Summary  The DASH eating plan is a healthy eating plan that has been shown to reduce high blood pressure (hypertension). It may also reduce your risk for type 2 diabetes, heart disease, and stroke.  When on the DASH eating plan, aim to eat more fresh fruits and vegetables, whole grains, lean proteins, low-fat dairy, and heart-healthy fats.  With the DASH eating plan, you should limit salt (sodium) intake to 2,300 mg a day. If you have hypertension, you may need to reduce your sodium intake to 1,500 mg a day.  Work with your health care provider or dietitian to adjust your eating plan to your individual calorie needs. This information is not intended to replace advice given to you by your health care provider. Make sure you discuss any questions you have with your health care provider. Document Revised: 07/10/2019 Document Reviewed: 07/10/2019 Elsevier Patient Education  2021 [place] With Less [employer] with less salt is one way to reduce the amount of sodium you get from food. Sodium is one of the elements that make up salt. It is found naturally in foods and is also added to certain foods. Depending on your condition and overall health, your health care provider or dietitian may recommend that you reduce your sodium intake. Most people should have less than 2,300 milligrams (mg) of sodium each day. If you have high blood pressure (hypertension), you may need to limit your sodium to 1,500 mg each day. Follow the tips below to help reduce your sodium intake. What are tips for eating less sodium? Reading food labels  Check the food label before buying or using packaged ingredients. Always check the label for the serving size and sodium content.  Look for products with no more than 140 mg of sodium in one serving.  Check the % Daily Value column to see what percent of the daily recommended amount of sodium is provided in one serving of the product. Foods with  5% or less in this column are considered low in sodium. Foods with 20% or higher are considered high in sodium.  Do not choose foods with salt as one of the first three ingredients on the ingredients list. If salt is one of the first three ingredients, it usually means the item is high in sodium.   Shopping  Buy sodium-free or low-sodium products. Look for the following words on food labels: ? Low-sodium. ? Sodium-free. ? Reduced-sodium. ? No salt added. ? Unsalted.  Always check the sodium content even if foods are labeled as low-sodium or no salt added.  Buy fresh foods. Cooking  Use herbs, seasonings without salt, and spices as substitutes for salt.  Use sodium-free baking soda when baking.  Grill, braise, or roast foods to add flavor with less salt.  Avoid adding salt to pasta, rice, or hot cereals.  Drain and rinse canned vegetables, beans, and meat  before use.  Avoid adding salt when cooking sweets and desserts.  Cook with low-sodium ingredients. What foods are high in sodium? Vegetables Regular canned vegetables (not low-sodium or reduced-sodium). Sauerkraut, pickled vegetables, and relishes. Olives. [place] fries. Onion rings. Regular canned tomato sauce and paste. Regular tomato and vegetable juice. Frozen vegetables in sauces. Grains Instant hot cereals. Bread stuffing, pancake, and biscuit mixes. Croutons. Seasoned rice or pasta mixes. Noodle soup cups. Boxed or frozen macaroni and cheese. Regular salted crackers. Self-rising flour. Rolls. Bagels. Flour tortillas and wraps. Meats and other proteins Meat or fish that is salted, canned, smoked, cured, spiced, or pickled. This includes bacon, ham, sausages, hot dogs, corned beef, chipped beef, meat loaves, salt pork, jerky, pickled herring, anchovies, regular canned tuna, and sardines. Salted nuts. Dairy Processed cheese and cheese spreads. Cheese curds. Blue cheese. Feta cheese. String cheese. Regular cottage cheese.  Buttermilk. Canned milk. The items listed above may not be a complete list of foods high in sodium. Actual amounts of sodium may be different depending on processing. Contact a dietitian for more information. What foods are low in sodium? Fruits Fresh, frozen, or canned fruit with no sauce added. Fruit juice. Vegetables Fresh or frozen vegetables with no sauce added. "No salt added" canned vegetables. "No salt added" tomato sauce and paste. Low-sodium or reduced-sodium tomato and vegetable juice. Grains Noodles, pasta, quinoa, rice. Shredded or puffed wheat or puffed rice. Regular or quick oats (not instant). Low-sodium crackers. Low-sodium bread. Whole-grain bread and whole-grain pasta. Unsalted popcorn. Meats and other proteins Fresh or frozen whole meats, poultry (not injected with sodium), and fish with no sauce added. Unsalted nuts. Dried peas, beans, and lentils without added salt. Unsalted canned beans. Eggs. Unsalted nut butters. Low-sodium canned tuna or chicken. Dairy Milk. Soy milk. Yogurt. Low-sodium cheeses, such as Swiss, Monte[place] Jack, [place], and [employer]. Sherbet or ice cream (keep to  cup per serving). Cream cheese. Fats and oils Unsalted butter or margarine. Other foods Homemade pudding. Sodium-free baking soda and baking powder. Herbs and spices. Low-sodium seasoning mixes. Beverages Coffee and tea. Carbonated beverages. The items listed above may not be a complete list of foods low in sodium. Actual amounts of sodium may be different depending on processing. Contact a dietitian for more information. What are some salt alternatives when cooking? The following are herbs, seasonings, and spices that can be used instead of salt to flavor your food. Herbs should be fresh or dried. Do not choose packaged mixes. Next to the name of the herb, spice, or seasoning are some examples of foods you can pair it with. Herbs  Bay leaves - Soups, meat and vegetable dishes, and  spaghetti sauce.  Basil - [employer], soups, pasta, and fish dishes.  Cilantro - Meat, poultry, and vegetable dishes.  Chili powder - Marinades and Mexican dishes.  Chives - Salad dressings and potato dishes.  Cumin - Mexican dishes, couscous, and meat dishes.  Dill - Fish dishes, sauces, and salads.  Fennel - Meat and vegetable dishes, breads, and cookies.  Garlic (do not use garlic salt) - [place] dishes, meat dishes, salad dressings, and sauces.  Marjoram - Soups, potato dishes, and meat dishes.  Oregano - Pizza and spaghetti sauce.  Parsley - Salads, soups, pasta, and meat dishes.  Rosemary - [place] dishes, salad dressings, soups, and red meats.  Saffron - Fish dishes, pasta, and some poultry dishes.  Sage - Stuffings and sauces.  Tarragon - Fish and [employer].  Thyme - Stuffing, meat,  and fish dishes. Seasonings  Lemon juice - Fish dishes, poultry dishes, vegetables, and salads.  Vinegar - Salad dressings, vegetables, and fish dishes. Spices  Cinnamon - Sweet dishes, such as cakes, cookies, and puddings.  Cloves - Gingerbread, puddings, and marinades for meats.  Curry - Vegetable dishes, fish and poultry dishes, and stir-fry dishes.  Ginger - Vegetable dishes, fish dishes, and stir-fry dishes.  Nutmeg - Pasta, vegetables, poultry, fish dishes, and custard. Summary  Cooking with less salt is one way to reduce the amount of sodium that you get from food.  Buy sodium-free or low-sodium products.  Check the food label before using or buying packaged ingredients.  Use herbs, seasonings without salt, and spices as substitutes for salt in foods. This information is not intended to replace advice given to you by your health care provider. Make sure you discuss any questions you have with your health care provider. Document Revised: 07/29/2019 Document Reviewed: 07/29/2019 Elsevier Patient Education  [place].

## 2020-12-21 LAB — MICROALBUMIN / CREATININE URINE RATIO
Creatinine,U: 20.3 mg/dL
Microalb Creat Ratio: 3.4 mg/g (ref 0.0–30.0)
Microalb, Ur: <0.7 mg/dL (ref 0.0–1.9)

## 2020-12-21 NOTE — Assessment & Plan Note (Addendum)
New diagnosis. Not at goal on 2.5 mg amlodipine daily .  Advised to suspend ibuprofen for one week to assess BP without NSAIDs.  If still elevated,  Will increase amlodipine to  5 mg daily.  Screening for proteinuria is negative.

## 2021-01-09 ENCOUNTER — Other Ambulatory Visit: Payer: Self-pay

## 2021-01-09 ENCOUNTER — Ambulatory Visit
Admission: EM | Admit: 2021-01-09 | Discharge: 2021-01-09 | Disposition: A | Discharge status: Home / Self Care | Payer: Medicare Other

## 2021-01-09 DIAGNOSIS — H9201 Otalgia, right ear: Secondary | ICD-10-CM

## 2021-01-09 DIAGNOSIS — H6981 Other specified disorders of Eustachian tube, right ear: Secondary | ICD-10-CM

## 2021-01-09 NOTE — ED Provider Notes (Signed)
Kimberly Gill    CSN: [id number] Arrival date & time: 01/09/21  1020      History   Chief Complaint Chief Complaint  Patient presents with  . Otalgia    Right ear pain x 3 days     HPI Kimberly Gill is a 70 y.o. female.   Patient presents with 3-day history of right ear pain.  She denies ear drainage, fever, chills, sore throat, cough, shortness of breath, vomiting, diarrhea, or other symptoms.  Treatment attempted at home with Advil and OTC eardrops without relief.  Her medical history includes hypertension.  The history is provided by the patient.    History reviewed. No pertinent past medical history.  Patient Active Problem List   Diagnosis Date Noted  . Essential hypertension 10/31/2020  . Postprandial RUQ pain 10/01/2020  . Fatigue 10/01/2020  . Osteopenia after menopause 06/10/2019  . Educated about COVID-19 virus infection 01/29/2019  . Insomnia 04/25/2017  . Postmenopausal atrophic vaginitis 04/14/2013  . Medicare annual wellness visit, subsequent 04/07/2012  . Encounter for preventive health examination 04/07/2012  . GERD (gastroesophageal reflux disease) 12/09/2011    Past Surgical History:  Procedure Laterality Date  . BREAST BIOPSY Left 09/09/2009   neg  . BUNIONECTOMY      OB History   No obstetric history on file.      Home Medications    Prior to Admission medications   Medication Sig Start Date End Date Taking? Authorizing Provider  amLODipine (NORVASC) 2.5 MG tablet Take 1 tablet (2.5 mg total) by mouth daily. 11/06/20   Crecencio Mc, MD  calcium citrate-vitamin D (CITRACAL+D) 315-200 MG-UNIT per tablet Take 1 tablet by mouth 2 (two) times daily.    [provider]  Multiple Vitamin (MULTIVITAMIN) tablet Take 1 tablet by mouth daily.    [provider]  zolpidem (AMBIEN) 10 MG tablet Take 1 tablet (10 mg total) by mouth at bedtime as needed. 09/29/20   Crecencio Mc, MD    Family History Family History   Problem Relation Age of Onset  . Hypertension Mother   . Osteoporosis Mother   . Congestive Heart Failure Mother   . Hypertension Father   . Mental retardation Father        dementia  . Congestive Heart Failure Father   . Alcohol abuse Sister   . Mental illness Sister   . Hypertension Sister     Social History Social History   Tobacco Use  . Smoking status: Never Smoker  . Smokeless tobacco: Never Used  Substance Use Topics  . Alcohol use: Yes  . Drug use: No     Allergies   Sulfa antibiotics, Penicillins, and Sulfa drugs cross reactors   Review of Systems Review of Systems  Constitutional: Negative for chills and fever.  HENT: Positive for ear pain. Negative for congestion, rhinorrhea and sore throat.   Respiratory: Negative for cough and shortness of breath.   Cardiovascular: Negative for chest pain and palpitations.  Gastrointestinal: Negative for abdominal pain, diarrhea and vomiting.  Skin: Negative for color change and rash.  All other systems reviewed and are negative.    Physical Exam Triage Vital Signs ED Triage Vitals  Enc Vitals Group     BP      Pulse      Resp      Temp      Temp src      SpO2      Weight  Height      Head Circumference      Peak Flow      Pain Score      Pain Loc      Pain Edu?      Excl. in [place]?    No data found.  Updated Vital Signs BP 139/79 (BP Location: Left Arm)   Pulse 66   Temp 97.7 F (36.5 C) (Temporal)   Resp 16   SpO2 98%   Visual Acuity Right Eye Distance:   Left Eye Distance:   Bilateral Distance:    Right Eye Near:   Left Eye Near:    Bilateral Near:     Physical Exam Vitals and nursing note reviewed.  Constitutional:      General: She is not in acute distress.    Appearance: She is well-developed. She is not ill-appearing.  HENT:     Head: Normocephalic and atraumatic.     Right Ear: Tympanic membrane and ear canal normal.     Left Ear: Tympanic membrane and ear canal normal.      Nose: Nose normal.     Mouth/Throat:     Mouth: Mucous membranes are moist.     Pharynx: Oropharynx is clear.  Eyes:     Conjunctiva/sclera: Conjunctivae normal.  Cardiovascular:     Rate and Rhythm: Normal rate and regular rhythm.     Heart sounds: Normal heart sounds.  Pulmonary:     Effort: Pulmonary effort is normal. No respiratory distress.     Breath sounds: Normal breath sounds.  Abdominal:     Palpations: Abdomen is soft.     Tenderness: There is no abdominal tenderness.  Musculoskeletal:     Cervical back: Neck supple.  Skin:    General: Skin is warm and dry.  Neurological:     General: No focal deficit present.     Mental Status: She is alert and oriented to person, place, and time.     Gait: Gait normal.  Psychiatric:        Mood and Affect: Mood normal.        Behavior: Behavior normal.      UC Treatments / Results  Labs (all labs ordered are listed, but only abnormal results are displayed) Labs Reviewed - No data to display  EKG   Radiology No results found.  Procedures Procedures (including critical care time)  Medications Ordered in UC Medications - No data to display  Initial Impression / Assessment and Plan / UC Course  I have reviewed the triage vital signs and the nursing notes.  Pertinent labs & imaging results that were available during my care of the patient were reviewed by me and considered in my medical decision making (see chart for details).   Right otalgia.  Right eustachian tube dysfunction.  Discussed treatment with Mucinex and Tylenol or ibuprofen.  Education provided on earaches and eustachian tube dysfunction.  Instructed patient to follow-up with her PCP or an ENT if her symptoms are not improving.  She agrees to plan of care.   Final Clinical Impressions(s) / UC Diagnoses   Final diagnoses:  Acute otalgia, right  Dysfunction of right eustachian tube     Discharge Instructions     Take plain over-the-counter Mucinex  as directed.  Take Tylenol or ibuprofen as needed for discomfort.  Follow up with your primary care provider if your symptoms are not improving.        ED Prescriptions    None  PDMP not reviewed this encounter.   Sharion Balloon, NP 01/09/21 [phone]

## 2021-01-09 NOTE — Discharge Instructions (Signed)
Take plain over-the-counter Mucinex as directed.  Take Tylenol or ibuprofen as needed for discomfort.  Follow up with your primary care provider if your symptoms are not improving.

## 2021-01-09 NOTE — ED Triage Notes (Signed)
Patient presents to Urgent Care with complaints of right ear pain x 3 days. Pt states she has some discomfort to throat when swallowing.  Treating with OTC ear drops. Has a hx of right ear infections.    Denies fever.

## 2021-01-18 ENCOUNTER — Ambulatory Visit: Payer: Medicare Other

## 2021-02-14 ENCOUNTER — Telehealth: Payer: Self-pay | Admitting: Internal Medicine

## 2021-02-14 NOTE — Telephone Encounter (Signed)
Left message for patient to call back and schedule Medicare Annual Wellness Visit (AWV) in office.   If not able to come in office, please offer to do virtually or by telephone.   Last AWV:04/24/2017  Please schedule at anytime with Nurse Health Advisor.

## 2021-02-22 ENCOUNTER — Other Ambulatory Visit: Payer: Self-pay | Admitting: Internal Medicine

## 2021-02-22 DIAGNOSIS — Z1231 Encounter for screening mammogram for malignant neoplasm of breast: Secondary | ICD-10-CM

## 2021-03-01 ENCOUNTER — Other Ambulatory Visit: Payer: Self-pay

## 2021-03-01 ENCOUNTER — Ambulatory Visit
Admission: RE | Admit: 2021-03-01 | Discharge: 2021-03-01 | Disposition: A | Discharge status: Home / Self Care | Payer: Medicare Other | Source: Ambulatory Visit | Attending: Internal Medicine | Admitting: Internal Medicine

## 2021-03-01 DIAGNOSIS — Z1231 Encounter for screening mammogram for malignant neoplasm of breast: Secondary | ICD-10-CM | POA: Insufficient documentation

## 2021-03-22 ENCOUNTER — Ambulatory Visit: Payer: Medicare Other | Admitting: Internal Medicine

## 2021-03-30 ENCOUNTER — Ambulatory Visit (INDEPENDENT_AMBULATORY_CARE_PROVIDER_SITE_OTHER): Payer: Medicare Other | Admitting: Internal Medicine

## 2021-03-30 ENCOUNTER — Encounter: Payer: Self-pay | Admitting: Internal Medicine

## 2021-03-30 ENCOUNTER — Other Ambulatory Visit: Payer: Self-pay

## 2021-03-30 VITALS — BP 124/76 | HR 75 | Temp 96.1°F | Resp 14 | Ht 65.0 in | Wt 137.4 lb

## 2021-03-30 DIAGNOSIS — I10 Essential (primary) hypertension: Secondary | ICD-10-CM

## 2021-03-30 DIAGNOSIS — Z23 Encounter for immunization: Secondary | ICD-10-CM

## 2021-03-30 LAB — COMPREHENSIVE METABOLIC PANEL
ALT: 11 U/L (ref 0–35)
AST: 22 U/L (ref 0–37)
Albumin: 4.2 g/dL (ref 3.5–5.2)
Alkaline Phosphatase: 48 U/L (ref 39–117)
BUN: 10 mg/dL (ref 6–23)
CO2: 28 mEq/L (ref 19–32)
Calcium: 9.8 mg/dL (ref 8.4–10.5)
Chloride: 101 mEq/L (ref 96–112)
Creatinine, Ser: 0.76 mg/dL (ref 0.40–1.20)
GFR: 78.99 mL/min (ref >60.00)
Glucose, Bld: 85 mg/dL (ref 70–99)
Potassium: 4.5 mEq/L (ref 3.5–5.1)
Sodium: 135 mEq/L (ref 135–145)
Total Bilirubin: 0.6 mg/dL (ref 0.2–1.2)
Total Protein: 7.2 g/dL (ref 6.0–8.3)

## 2021-03-30 NOTE — Progress Notes (Signed)
Subjective:  Patient ID: Kimberly Gill, female    DOB: 1951/04/12  Age: 70 y.o. MRN: [id number]  CC: The primary encounter diagnosis was COVID-19 vaccine administered. A diagnosis of Essential hypertension was also pertinent to this visit.  HPI Kimberly Gill presents for 6 month follow up  This visit occurred during the SARS-CoV-2 public health emergency.  Safety protocols were in place, including screening questions prior to the visit, additional usage of staff PPE, and extensive cleaning of exam room while observing appropriate contact time as indicated for disinfecting solutions.   Hypertension: patient checks blood pressure twice weekly at home.  Readings have been for the most part < 130/80 at rest  on 2.5 mg amlodipine . Patient is following a reduce salt diet most days and is taking medications as prescribed .  She is wondering if she will eventually be able to control her blood pressure without medication .  Reviewed the benefits of exercise .  She is not overweight .  She practices  yoga several times per week but does not do  cardio.   Outpatient Medications Prior to Visit  Medication Sig Dispense Refill   amLODipine (NORVASC) 2.5 MG tablet Take 1 tablet (2.5 mg total) by mouth daily. 90 tablet 3   calcium citrate-vitamin D (CITRACAL+D) 315-200 MG-UNIT per tablet Take 1 tablet by mouth 2 (two) times daily.     Multiple Vitamin (MULTIVITAMIN) tablet Take 1 tablet by mouth daily.     zolpidem (AMBIEN) 10 MG tablet Take 1 tablet (10 mg total) by mouth at bedtime as needed. 30 tablet 5   No facility-administered medications prior to visit.    Review of Systems;  Patient denies headache, fevers, malaise, unintentional weight loss, skin rash, eye pain, sinus congestion and sinus pain, sore throat, dysphagia,  hemoptysis , cough, dyspnea, wheezing, chest pain, palpitations, orthopnea, edema, abdominal pain, nausea, melena, diarrhea, constipation, flank pain, dysuria,  hematuria, urinary  Frequency, nocturia, numbness, tingling, seizures,  Focal weakness, Loss of consciousness,  Tremor, insomnia, depression, anxiety, and suicidal ideation.      Objective:  BP 124/76 (BP Location: Left Arm, Patient Position: Sitting, Cuff Size: Normal)   Pulse 75   Temp (!) 96.1 F (35.6 C) (Temporal)   Resp 14   Ht '5\' 5"'$  (1.651 m)   Wt 137 lb 6.4 oz (62.3 kg)   SpO2 98%   BMI 22.86 kg/m   BP Readings from Last 3 Encounters:  03/30/21 124/76  01/09/21 139/79  12/20/20 (!) 148/84    Wt Readings from Last 3 Encounters:  03/30/21 137 lb 6.4 oz (62.3 kg)  12/20/20 140 lb 12.8 oz (63.9 kg)  09/29/20 141 lb 12.8 oz (64.3 kg)    General appearance: alert, cooperative and appears stated age Ears: normal TM's and external ear canals both ears Throat: lips, mucosa, and tongue normal; teeth and gums normal Neck: no adenopathy, no carotid bruit, supple, symmetrical, trachea midline and thyroid not enlarged, symmetric, no tenderness/mass/nodules Back: symmetric, no curvature. ROM normal. No CVA tenderness. Lungs: clear to auscultation bilaterally Heart: regular rate and rhythm, S1, S2 normal, no murmur, click, rub or gallop Abdomen: soft, non-tender; bowel sounds normal; no masses,  no organomegaly Pulses: 2+ and symmetric Skin: Skin color, texture, turgor normal. No rashes or lesions Lymph nodes: Cervical, supraclavicular, and axillary nodes normal.  No results found for: HGBA1C  Lab Results  Component Value Date   CREATININE 0.76 03/30/2021   CREATININE 0.73 09/29/2020   CREATININE 0.82  06/10/2019    Lab Results  Component Value Date   WBC 5.8 09/29/2020   HGB 13.7 09/29/2020   HCT 40.8 09/29/2020   PLT 248.0 09/29/2020   GLUCOSE 85 03/30/2021   CHOL 259 (H) 09/29/2020   TRIG 58.0 09/29/2020   HDL 108.40 09/29/2020   LDLDIRECT 113 04/19/2016   LDLCALC 139 (H) 09/29/2020   ALT 11 03/30/2021   AST 22 03/30/2021   NA 135 03/30/2021   K 4.5  03/30/2021   CL 101 03/30/2021   CREATININE 0.76 03/30/2021   BUN 10 03/30/2021   CO2 28 03/30/2021   TSH 2.81 09/29/2020   MICROALBUR <0.7 12/20/2020    MM 3D SCREEN BREAST BILATERAL  Result Date: 03/03/2021 CLINICAL DATA:  Screening. EXAM: DIGITAL SCREENING BILATERAL MAMMOGRAM WITH TOMOSYNTHESIS AND CAD TECHNIQUE: Bilateral screening digital craniocaudal and mediolateral oblique mammograms were obtained. Bilateral screening digital breast tomosynthesis was performed. The images were evaluated with computer-aided detection. COMPARISON:  Previous exam(s). ACR Breast Density Category c: The breast tissue is heterogeneously dense, which may obscure small masses. FINDINGS: There are no findings suspicious for malignancy. IMPRESSION: No mammographic evidence of malignancy. A result letter of this screening mammogram will be mailed directly to the patient. RECOMMENDATION: Screening mammogram in one year. (Code:SM-B-01Y) BI-RADS CATEGORY  1: Negative. Electronically Signed   By: Margarette Canada M.D.   On: 03/03/2021 15:40   Assessment & Plan:   Problem List Items Addressed This Visit       Unprioritized   Essential hypertension    Well controlled on current regimen of amlodipine 2.5 mg daily . Renal function stable, no changes today.  Lab Results  Component Value Date   CREATININE 0.76 03/30/2021   Lab Results  Component Value Date   NA 135 03/30/2021   K 4.5 03/30/2021   CL 101 03/30/2021   CO2 28 03/30/2021         Relevant Orders   Comprehensive metabolic panel (Completed)   Other Visit Diagnoses     COVID-19 vaccine administered    -  Primary   Relevant Orders   SARS-CoV-2 Semi-Quantitative Total Antibody, Spike       There are no discontinued medications.  Follow-up: Return in about 6 months (around 09/30/2021).   Crecencio Mc, MD

## 2021-03-30 NOTE — Patient Instructions (Addendum)
I recommend the shingles vaccine and the Moderna NEW vaccine in the fall   The ShingRx vaccine is now available in local pharmacies and is much more protective than the old one  Zostavax  (it is about 97%  Effective in preventing shingles).   It is therefore ADVISED for all interested adults over 50 to prevent shingles so I have printed you a prescription for it.  (it requires a 2nd dose 2 to 6 months after the first one) .  It will cause you to have flu  like symptoms for 2 days If your pharmacy is not available to give it to you,  The [facility] pharmacy will give it to you.  They are located on the ground floor of the [facility] clinic   You cannot have it on the same day as your high dose flu vaccine

## 2021-04-01 NOTE — Assessment & Plan Note (Signed)
Well controlled on current regimen of amlodipine 2.5 mg daily . Renal function stable, no changes today.  Lab Results  Component Value Date   CREATININE 0.76 03/30/2021   Lab Results  Component Value Date   NA 135 03/30/2021   K 4.5 03/30/2021   CL 101 03/30/2021   CO2 28 03/30/2021

## 2021-04-02 LAB — SARS-COV-2 SEMI-QUANTITATIVE TOTAL ANTIBODY, SPIKE: SARS COV2 AB, Total Spike Semi QN: 440 U/mL — ABNORMAL HIGH (ref <0.8)

## 2021-04-12 ENCOUNTER — Other Ambulatory Visit: Payer: Self-pay | Admitting: Internal Medicine

## 2021-04-13 NOTE — Telephone Encounter (Signed)
Refilled: 09/29/2020 Last OV: 03/30/2021 Next OV: 2/16/20223

## 2021-05-02 ENCOUNTER — Telehealth (INDEPENDENT_AMBULATORY_CARE_PROVIDER_SITE_OTHER): Payer: Medicare Other | Admitting: Internal Medicine

## 2021-05-02 ENCOUNTER — Encounter: Payer: Self-pay | Admitting: Internal Medicine

## 2021-05-02 ENCOUNTER — Telehealth: Payer: Self-pay | Admitting: Internal Medicine

## 2021-05-02 DIAGNOSIS — U071 COVID-19: Secondary | ICD-10-CM

## 2021-05-02 HISTORY — DX: COVID-19: U07.1

## 2021-05-02 MED ORDER — MOLNUPIRAVIR EUA 200MG CAPSULE: 4.0000 | ORAL_CAPSULE | Freq: Two times a day (BID) | ORAL | 0 refills | Status: AC

## 2021-05-02 NOTE — Progress Notes (Signed)
Virtual Visit via [place] Note  This visit type was conducted due to national recommendations for restrictions regarding the COVID-19 pandemic (e.g. social distancing).  This format is felt to be most appropriate for this patient at this time.  All issues noted in this document were discussed and addressed.  No physical exam was performed (except for noted visual exam findings with Video Visits).   I connected withNAME@ on 05/02/21 at  4:30 PM EDT by a video enabled telemedicine application  and verified that I am speaking with the correct person using two identifiers. Location patient: home Location provider: work or home office Persons participating in the virtual visit: patient, provider  I discussed the limitations, risks, security and privacy concerns of performing an evaluation and management service by telephone and the availability of in person appointments. I also discussed with the patient that there may be a patient responsible charge related to this service. The patient expressed understanding and agreed to proceed.   Reason for visit: COVID infection  HPI: 70 yr old female presents after testing postiive for COVID infection today after developing COVID symptoms yesterday with mild cough  rhinitis.  She denies fevers, body aches and    ROS: See pertinent positives and negatives per HPI.  No past medical history on file.  Past Surgical History:  Procedure Laterality Date   BREAST BIOPSY Left 09/09/2009   neg   BUNIONECTOMY      Family History  Problem Relation Age of Onset   Hypertension Mother    Osteoporosis Mother    Congestive Heart Failure Mother    Hypertension Father    Mental retardation Father        dementia   Congestive Heart Failure Father    Alcohol abuse Sister    Mental illness Sister    Hypertension Sister    Breast cancer Neg Hx     SOCIAL HX:  reports that she has never smoked. She has never used smokeless tobacco. She reports current  alcohol use. She reports that she does not use drugs.    Current Outpatient Medications:    amLODipine (NORVASC) 2.5 MG tablet, Take 1 tablet (2.5 mg total) by mouth daily., Disp: 90 tablet, Rfl: 3   calcium citrate-vitamin D (CITRACAL+D) 315-200 MG-UNIT per tablet, Take 1 tablet by mouth 2 (two) times daily., Disp: , Rfl:    molnupiravir EUA (LAGEVRIO) 200 mg CAPS capsule, Take 4 capsules (800 mg total) by mouth 2 (two) times daily for 5 days., Disp: 40 capsule, Rfl: 0   Multiple Vitamin (MULTIVITAMIN) tablet, Take 1 tablet by mouth daily., Disp: , Rfl:    zolpidem (AMBIEN) 10 MG tablet, TAKE 1 TABLET BY MOUTH AT BEDTIME AS NEEDED., Disp: 30 tablet, Rfl: 5  EXAM:  VITALS per patient if applicable:  GENERAL: alert, oriented, appears well and in no acute distress  HEENT: atraumatic, conjunttiva clear, no obvious abnormalities on inspection of external nose and ears  NECK: normal movements of the head and neck  LUNGS: on inspection no signs of respiratory distress, breathing rate appears normal, no obvious gross SOB, gasping or wheezing  CV: no obvious cyanosis  MS: moves all visible extremities without noticeable abnormality  PSYCH/NEURO: pleasant and cooperative, no obvious depression or anxiety, speech and thought processing grossly intact  ASSESSMENT AND PLAN:  Discussed the following assessment and plan:  Acute COVID-19  Acute COVID-19 Symptoms started on Sept 12 and she tested positive today with mild symptoms are mild,  But given he age  will prescribe molnupiravir     I discussed the assessment and treatment plan with the patient. The patient was provided an opportunity to ask questions and all were answered. The patient agreed with the plan and demonstrated an understanding of the instructions.   The patient was advised to call back or seek an in-person evaluation if the symptoms worsen or if the condition fails to improve as anticipated.   I spent 30 minutes  dedicated to the care of this patient on the date of this encounter to include pre-visit review of his medical history,  Face-to-face time with the patient , and post visit ordering of testing and therapeutics.    Crecencio Mc, MD

## 2021-05-02 NOTE — Assessment & Plan Note (Signed)
Symptoms started on Sept 12 and she tested positive today with mild symptoms are mild,  But given he age will prescribe molnupiravir

## 2021-05-02 NOTE — Patient Instructions (Signed)
HERE'S HOW YOU MANAGE COVID INFECTION :  VITAMIN C 1000 MG DAILY VITAMIN D 4000 IU DAILY  ZINC 50 MG DAILY   (stop these megadoses once you are feeling better) If you are having nausea:  Family member  needs to pick up gatorade, ginger ale,   and Chicken broth.  No sugar free drinks .   And once you  can keep those down , you  can add noodles and saltines and white meat chicken .    You can take 2400 mg of advil daily in divided doses (600 mg every 6 hours)  or you  can use or alternate with tylenol  (max daily dose is  2000 mg of acetominophen (tylenol)  In divided doses (500 mg every 6 hours  Or 1000 mg every 12 hours.)    Use sudafed,  Sudafed PE or Afrin nasal spray for the congestion.   Start the molnupiravir by day 5 of symptoms if they worsen.   YOU SHOULD BE FEVER FREE FOR 24 HOURS WITHOUT THE USE OF ADVIL OR MOTRIN ,  AND ALL SYMPTOMS NEED TO BE IMPROVING BEFORE YOU CAN BREAK QUARANTINE (MINIMUM OF 5 DAYS  FROM DAY 1 OF SYMPTOMS )   DO NOT RETEST YOURSELF FOR COVID.  THE TEST MAY REMAIN POSITIVE FOR AT LEAST 30 DAYS!

## 2021-05-02 NOTE — Telephone Encounter (Signed)
Patient tested positive for covid about an hour ago. Symptoms: no fever, little cough, no sore threat, little headache. She feels like she has a mild cold. No appointments available. Patent was transferred to Access Nurse.

## 2021-06-02 DIAGNOSIS — Z23 Encounter for immunization: Secondary | ICD-10-CM | POA: Diagnosis not present

## 2021-06-16 ENCOUNTER — Telehealth: Payer: Self-pay

## 2021-06-16 DIAGNOSIS — D2272 Melanocytic nevi of left lower limb, including hip: Secondary | ICD-10-CM | POA: Diagnosis not present

## 2021-06-16 DIAGNOSIS — D2271 Melanocytic nevi of right lower limb, including hip: Secondary | ICD-10-CM | POA: Diagnosis not present

## 2021-06-16 DIAGNOSIS — D2261 Melanocytic nevi of right upper limb, including shoulder: Secondary | ICD-10-CM | POA: Diagnosis not present

## 2021-06-16 DIAGNOSIS — D2262 Melanocytic nevi of left upper limb, including shoulder: Secondary | ICD-10-CM | POA: Diagnosis not present

## 2021-06-16 DIAGNOSIS — D225 Melanocytic nevi of trunk: Secondary | ICD-10-CM | POA: Diagnosis not present

## 2021-06-16 DIAGNOSIS — L821 Other seborrheic keratosis: Secondary | ICD-10-CM | POA: Diagnosis not present

## 2021-06-16 NOTE — Telephone Encounter (Signed)
PA for Zolpidem has been submitted on covermymeds.

## 2021-06-27 NOTE — Telephone Encounter (Signed)
PA has been approved through 06/18/2022.

## 2021-09-14 ENCOUNTER — Other Ambulatory Visit: Payer: Self-pay | Admitting: Internal Medicine

## 2021-10-05 ENCOUNTER — Other Ambulatory Visit: Payer: Self-pay

## 2021-10-05 ENCOUNTER — Encounter: Payer: Self-pay | Admitting: Internal Medicine

## 2021-10-05 ENCOUNTER — Ambulatory Visit (INDEPENDENT_AMBULATORY_CARE_PROVIDER_SITE_OTHER): Payer: Medicare Other | Admitting: Internal Medicine

## 2021-10-05 VITALS — BP 142/72 | HR 76 | Temp 97.6°F | Ht 65.0 in | Wt 138.4 lb

## 2021-10-05 DIAGNOSIS — Z780 Asymptomatic menopausal state: Secondary | ICD-10-CM

## 2021-10-05 DIAGNOSIS — Z1231 Encounter for screening mammogram for malignant neoplasm of breast: Secondary | ICD-10-CM

## 2021-10-05 DIAGNOSIS — K7689 Other specified diseases of liver: Secondary | ICD-10-CM | POA: Diagnosis not present

## 2021-10-05 DIAGNOSIS — M8580 Other specified disorders of bone density and structure, unspecified site: Secondary | ICD-10-CM

## 2021-10-05 DIAGNOSIS — I10 Essential (primary) hypertension: Secondary | ICD-10-CM | POA: Diagnosis not present

## 2021-10-05 DIAGNOSIS — R5383 Other fatigue: Secondary | ICD-10-CM

## 2021-10-05 DIAGNOSIS — D126 Benign neoplasm of colon, unspecified: Secondary | ICD-10-CM

## 2021-10-05 DIAGNOSIS — Z23 Encounter for immunization: Secondary | ICD-10-CM | POA: Diagnosis not present

## 2021-10-05 DIAGNOSIS — Z0000 Encounter for general adult medical examination without abnormal findings: Secondary | ICD-10-CM | POA: Diagnosis not present

## 2021-10-05 DIAGNOSIS — E785 Hyperlipidemia, unspecified: Secondary | ICD-10-CM | POA: Diagnosis not present

## 2021-10-05 LAB — LIPID PANEL
Cholesterol: 272 mg/dL — ABNORMAL HIGH (ref 0–200)
HDL: 116.7 mg/dL (ref >39.00)
LDL Cholesterol: 140 mg/dL — ABNORMAL HIGH (ref 0–99)
NonHDL: 155.33
Total CHOL/HDL Ratio: 2
Triglycerides: 75 mg/dL (ref 0.0–149.0)
VLDL: 15 mg/dL (ref 0.0–40.0)

## 2021-10-05 LAB — TSH: TSH: 3.76 u[IU]/mL (ref 0.35–5.50)

## 2021-10-05 LAB — MICROALBUMIN / CREATININE URINE RATIO
Creatinine,U: 42.3 mg/dL
Microalb Creat Ratio: 1.7 mg/g (ref 0.0–30.0)
Microalb, Ur: <0.7 mg/dL (ref 0.0–1.9)

## 2021-10-05 LAB — COMPREHENSIVE METABOLIC PANEL
ALT: 11 U/L (ref 0–35)
AST: 21 U/L (ref 0–37)
Albumin: 4.5 g/dL (ref 3.5–5.2)
Alkaline Phosphatase: 56 U/L (ref 39–117)
BUN: 10 mg/dL (ref 6–23)
CO2: 33 mEq/L — ABNORMAL HIGH (ref 19–32)
Calcium: 9.7 mg/dL (ref 8.4–10.5)
Chloride: 100 mEq/L (ref 96–112)
Creatinine, Ser: 0.76 mg/dL (ref 0.40–1.20)
GFR: 78.7 mL/min (ref >60.00)
Glucose, Bld: 89 mg/dL (ref 70–99)
Potassium: 4.1 mEq/L (ref 3.5–5.1)
Sodium: 136 mEq/L (ref 135–145)
Total Bilirubin: 0.7 mg/dL (ref 0.2–1.2)
Total Protein: 7.3 g/dL (ref 6.0–8.3)

## 2021-10-05 MED ORDER — ZOSTER VAC RECOMB ADJUVANTED 50 MCG/0.5ML IM SUSR: 0.5000 mL | Freq: Once | INTRAMUSCULAR | 1 refills | Status: AC

## 2021-10-05 NOTE — Assessment & Plan Note (Signed)

## 2021-10-05 NOTE — Assessment & Plan Note (Signed)
Several small cysts noted on 2022 RUQ ultrasound with no signs of malignancy or obstruction

## 2021-10-05 NOTE — Progress Notes (Signed)
Patient ID: Kimberly Gill, female    DOB: Jul 26, 1951  Age: 71 y.o. MRN: [id number]  The patient is here for annual preventive  examination and management of other chronic and acute problems.  This visit occurred during the SARS-CoV-2 public health emergency.  Safety protocols were in place, including screening questions prior to the visit, additional usage of staff PPE, and extensive cleaning of exam room while observing appropriate contact time as indicated for disinfecting solutions.     The risk factors are reflected in the social history.  The roster of all physicians providing medical care to patient - is listed in the Snapshot section of the chart.  Activities of daily living:  The patient is 100% independent in all ADLs: dressing, toileting, feeding as well as independent mobility  Home safety : The patient has smoke detectors in the home. They wear seatbelts.  There are no firearms at home. There is no violence in the home.   There is no risks for hepatitis, STDs or HIV. There is no   history of blood transfusion. They have no travel history to infectious disease endemic areas of the world.  The patient has seen their dentist in the last six month. They have seen their eye doctor in the last year. They admit to slight hearing difficulty with regard to whispered voices and some television programs.  They have deferred audiologic testing in the last year.  They do not  have excessive sun exposure. Discussed the need for sun protection: hats, long sleeves and use of sunscreen if there is significant sun exposure.   Diet: the importance of a healthy diet is discussed. They do have a healthy diet.  The benefits of regular aerobic exercise were discussed. She walks 5 times per week ,  30 minutes and takes yoga 4 time per week    Depression screen: there are no signs or vegative symptoms of depression- irritability, change in appetite, anhedonia, sadness/tearfullness.  Cognitive  assessment: the patient manages all their financial and personal affairs and is actively engaged. They could relate day,date,year and events; recalled 2/3 objects at 3 minutes; performed clock-face test normally.  The following portions of the patient's history were reviewed and updated as appropriate: allergies, current medications, past family history, past medical history,  past surgical history, past social history  and problem list.  Visual acuity was not assessed per patient preference since she has regular follow up with her ophthalmologist. Hearing and body mass index were assessed and reviewed.   During the course of the visit the patient was educated and counseled about appropriate screening and preventive services including : fall prevention , diabetes screening, nutrition counseling, colorectal cancer screening, and recommended immunizations.    CC: The primary encounter diagnosis was Encounter for screening mammogram for malignant neoplasm of breast. Diagnoses of Essential hypertension, Fatigue, unspecified type, Hyperlipidemia, unspecified hyperlipidemia type, Encounter for preventive health examination, Osteopenia after menopause, Simple hepatic cyst, Tubular adenoma of colon, and Need for pneumococcal vaccination were also pertinent to this visit.  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  The sale of the business has been contentious and he remains in conflict with brother,  .  Recently heveloped an UIG bleed from a  mallory weiss tear last month,  had to be hospitalized.    2)  Hypertension: patient checks blood pressure twice weekly at home.  Readings have been for the most part > 140/80  at rest . Patient is following a reduce salt diet most days and is taking medications as prescribed    History [name] has a past medical history of Acute COVID-19 (05/02/2021).   She has a past surgical history that includes Bunionectomy and  Breast biopsy (Left, 09/09/2009).   Her family history includes Alcohol abuse in her sister; Congestive Heart Failure in her father and mother; Hypertension in her father, mother, and sister; Mental illness in her sister; Mental retardation in her father; Osteoporosis in her mother.She reports that she has never smoked. She has never used smokeless tobacco. She reports current alcohol use. She reports that she does not use drugs.  Outpatient Medications Prior to Visit  Medication Sig Dispense Refill   amLODipine (NORVASC) 2.5 MG tablet TAKE 1 TABLET BY MOUTH EVERY DAY 90 tablet 3   calcium citrate-vitamin D (CITRACAL+D) 315-200 MG-UNIT per tablet Take 1 tablet by mouth 2 (two) times daily.     Multiple Vitamin (MULTIVITAMIN) tablet Take 1 tablet by mouth daily.     zolpidem (AMBIEN) 10 MG tablet TAKE 1 TABLET BY MOUTH AT BEDTIME AS NEEDED. 30 tablet 5   No facility-administered medications prior to visit.    Review of Systems  Patient denies headache, fevers, malaise, unintentional weight loss, skin rash, eye pain, sinus congestion and sinus pain, sore throat, dysphagia,  hemoptysis , cough, dyspnea, wheezing, chest pain, palpitations, orthopnea, edema, abdominal pain, nausea, melena, diarrhea, constipation, flank pain, dysuria, hematuria, urinary  Frequency, nocturia, numbness, tingling, seizures,  Focal weakness, Loss of consciousness,  Tremor, depression, anxiety, and suicidal ideation.     Objective:  BP (!) 142/72 (BP Location: Left Arm, Patient Position: Sitting, Cuff Size: Normal)    Pulse 76    Temp 97.6 F (36.4 C) (Oral)    Ht '5\' 5"'  (1.651 m)    Wt 138 lb 6.4 oz (62.8 kg)    SpO2 98%    BMI 23.03 kg/m   Physical Exam  General appearance: alert, cooperative and appears stated age Head: Normocephalic, without obvious abnormality, atraumatic Eyes: conjunctivae/corneas clear. PERRL, EOM's intact. Fundi benign. Ears: normal TM's and external ear canals both ears Nose: Nares normal.  Septum midline. Mucosa normal. No drainage or sinus tenderness. Throat: lips, mucosa, and tongue normal; teeth and gums normal Neck: no adenopathy, no carotid bruit, no JVD, supple, symmetrical, trachea midline and thyroid not enlarged, symmetric, no tenderness/mass/nodules Lungs: clear to auscultation bilaterally Breasts: normal appearance, no masses or tenderness Heart: regular rate and rhythm, S1, S2 normal, no murmur, click, rub or gallop Abdomen: soft, non-tender; bowel sounds normal; no masses,  no organomegaly Extremities: extremities normal, atraumatic, no cyanosis or edema Pulses: 2+ and symmetric Skin: Skin color, texture, turgor normal. No rashes or lesions Neurologic: Alert and oriented X 3, normal strength and tone. Normal symmetric reflexes. Normal coordination and gait.     Assessment & Plan:   Problem List Items Addressed This Visit     Encounter for preventive health examination    age appropriate education and counseling updated, referrals for preventative services and immunizations addressed, dietary and smoking counseling addressed, most recent labs reviewed.  I have personally reviewed and have noted:   1) the patient's medical and social history 2) The pt's use of alcohol, tobacco, and illicit drugs 3) The patient's current medications and supplements 4) Functional ability including ADL's, fall risk, home safety risk, hearing and visual impairment 5) Diet and physical activities 6) Evidence for depression or mood disorder 7) The patient's  height, weight, and BMI have been recorded in the chart      I have made referrals, and provided counseling and education based on review of the above      Essential hypertension   Relevant Orders   Comp Met (CMET) (Completed)   Urine Microalbumin w/creat. ratio (Completed)   Fatigue   Relevant Orders   TSH (Completed)   Osteopenia after menopause   Relevant Orders   DG Bone Density   Simple hepatic cyst    Several  small cysts noted on 2022 RUQ ultrasound with no signs of malignancy or obstruction       Other Visit Diagnoses     Encounter for screening mammogram for malignant neoplasm of breast    -  Primary   Relevant Orders   MM 3D SCREEN BREAST BILATERAL   Hyperlipidemia, unspecified hyperlipidemia type       Relevant Orders   Lipid Profile (Completed)   Tubular adenoma of colon       Relevant Orders   Ambulatory referral to Gastroenterology   Need for pneumococcal vaccination       Relevant Orders   Pneumococcal conjugate vaccine 20-valent (Prevnar 20) (Completed)       I am having Ardean C. Lyerly start on Zoster Vaccine Adjuvanted. I am also having her maintain her multivitamin, calcium citrate-vitamin D, zolpidem, and amLODipine.  Meds ordered this encounter  Medications   Zoster Vaccine Adjuvanted [facility]) injection    Sig: Inject 0.5 mLs into the muscle once for 1 dose.    Dispense:  1 each    Refill:  1    There are no discontinued medications.  Follow-up: No follow-ups on file.   Crecencio Mc, MD

## 2021-10-05 NOTE — Patient Instructions (Addendum)
Your annual mammogram AND  DEXA  SCAN have been ordered.  Please call [place] to call to make your appointments  .  The phone number for [place] [facility] is  [phone]    The Tdap (tetanus-diphtheria-whooping cough vaccine ) and Shingrix vaccines are now  COVERED BY MEDICARE if you get them at your pharmacy . Your tetanus vaccine is due this year    You can expect 24 hours of flu like symptoms after receiving both doses of the shingles vaccine, so plan accordingly.

## 2021-10-09 ENCOUNTER — Telehealth: Payer: Self-pay

## 2021-10-09 NOTE — Telephone Encounter (Signed)
Spoke with patient she wanted to be taken off our cal list so I closed her refferal

## 2021-10-17 ENCOUNTER — Other Ambulatory Visit: Payer: Self-pay | Admitting: Internal Medicine

## 2022-01-03 ENCOUNTER — Telehealth: Payer: Self-pay | Admitting: Internal Medicine

## 2022-01-03 NOTE — Telephone Encounter (Signed)
Copied from [place] [phone]. Topic: Medicare AWV ?>> Jan 03, 2022 10:04 AM Harris-Coley, Hannah Beat wrote: ?Reason for CRM: Left message for patient to schedule Annual Wellness Visit.  Please schedule with Nurse Health Advisor Denisa O'Brien-Blaney, LPN at [facility].  Please call [phone] ask for Juliann Pulse ?

## 2022-03-27 ENCOUNTER — Ambulatory Visit (INDEPENDENT_AMBULATORY_CARE_PROVIDER_SITE_OTHER): Payer: Medicare Other

## 2022-03-27 VITALS — Ht 65.0 in | Wt 138.0 lb

## 2022-03-27 DIAGNOSIS — Z0000 Encounter for general adult medical examination without abnormal findings: Secondary | ICD-10-CM | POA: Diagnosis not present

## 2022-03-27 NOTE — Patient Instructions (Addendum)
  Ms. Tetro , Thank you for taking time to come for your Medicare Wellness Visit. I appreciate your ongoing commitment to your health goals. Please review the following plan we discussed and let me know if I can assist you in the future.   These are the goals we discussed:  Goals       Patient Stated     Increase physical activity (pt-stated)      I would like to walk more for exercise.      Other     Increase water intake        This is a list of the screening recommended for you and due dates:  Health Maintenance  Topic Date Due   Mammogram  03/01/2022   Flu Shot  03/20/2022   COVID-19 Vaccine (4 - Pfizer series) 04/12/2022*   Zoster (Shingles) Vaccine (1 of 2) 04/20/2022*   Tetanus Vaccine  05/20/2022*   Colon Cancer Screening  07/24/2026   Pneumonia Vaccine  Completed   DEXA scan (bone density measurement)  Completed   Hepatitis C Screening: USPSTF Recommendation to screen - Ages 10-79 yo.  Completed   HPV Vaccine  Aged Out  *Topic was postponed. The date shown is not the original due date.

## 2022-03-27 NOTE — Progress Notes (Addendum)
Subjective:   Kimberly Gill is a 71 y.o. female who presents for Medicare Annual (Subsequent) preventive examination.  Review of Systems    No ROS.  Medicare Wellness Virtual Visit.  Visual/audio telehealth visit, UTA vital signs.   See social history for additional risk factors.   Cardiac Risk Factors include: advanced age (>57mn, >>41women)     Objective:    Today's Vitals   03/27/22 1407  Weight: 138 lb (62.6 kg)  Height: '5\' 5"'$  (1.651 m)   Body mass index is 22.96 kg/m.     03/27/2022    2:17 PM 04/24/2017    9:54 AM  Advanced Directives  Does Patient Have a Medical Advance Directive? Yes Yes  Type of AParamedicof AHavenLiving will HR[place]Living will  Does patient want to make changes to medical advance directive? No - Patient declined No - Patient declined  Copy of H[place]in Chart? No - copy requested No - copy requested    Current Medications (verified) Outpatient Encounter Medications as of 03/27/2022  Medication Sig   amLODipine (NORVASC) 2.5 MG tablet TAKE 1 TABLET BY MOUTH EVERY DAY   calcium citrate-vitamin D (CITRACAL+D) 315-200 MG-UNIT per tablet Take 1 tablet by mouth 2 (two) times daily.   Multiple Vitamin (MULTIVITAMIN) tablet Take 1 tablet by mouth daily.   zolpidem (AMBIEN) 10 MG tablet TAKE 1 TABLET BY MOUTH EVERY DAY AT BEDTIME AS NEEDED   No facility-administered encounter medications on file as of 03/27/2022.    Allergies (verified) Sulfa antibiotics, Penicillins, and Sulfa drugs cross reactors   History: Past Medical History:  Diagnosis Date   Acute COVID-19 05/02/2021   Past Surgical History:  Procedure Laterality Date   BREAST BIOPSY Left 09/09/2009   neg   BUNIONECTOMY     Family History  Problem Relation Age of Onset   Hypertension Mother    Osteoporosis Mother    Congestive Heart Failure Mother    Hypertension Father    Mental retardation Father         dementia   Congestive Heart Failure Father    Alcohol abuse Sister    Mental illness Sister    Hypertension Sister    Breast cancer Neg Hx    Social History   Socioeconomic History   Marital status: Married    Spouse name: Not on file   Number of children: Not on file   Years of education: Not on file   Highest education level: Not on file  Occupational History   Not on file  Tobacco Use   Smoking status: Never   Smokeless tobacco: Never  Vaping Use   Vaping Use: Not on file  Substance and Sexual Activity   Alcohol use: Yes   Drug use: No   Sexual activity: Yes  Other Topics Concern   Not on file  Social History Narrative   Retired, married   Social Determinants of Health   Financial Resource Strain: LLynd (03/27/2022)   Overall Financial Resource Strain (CARDIA)    Difficulty of Paying Living Expenses: Not hard at all  Food Insecurity: No Food Insecurity (03/27/2022)   Hunger Vital Sign    Worried About Running Out of Food in the Last Year: Never true    Ran Out of Food in the Last Year: Never true  Transportation Needs: No Transportation Needs (03/27/2022)   PRAPARE - THydrologist(Medical): No  Lack of Transportation (Non-Medical): No  Physical Activity: Sufficiently Active (03/27/2022)   Exercise Vital Sign    Days of Exercise per Week: 3 days    Minutes of Exercise per Session: 60 min  Stress: No Stress Concern Present (03/27/2022)   [place]    Feeling of Stress : Not at all  Social Connections: Unknown (03/27/2022)   Social Connection and Isolation Panel [NHANES]    Frequency of Communication with Friends and Family: Not on file    Frequency of Social Gatherings with Friends and Family: Not on file    Attends Religious Services: Not on file    Active Member of Clubs or Organizations: Not on file    Attends Archivist Meetings: Not on file    Marital  Status: Married   Tobacco Counseling Counseling given: Not Answered   Clinical Intake:  Pre-visit preparation completed: Yes        Diabetes: No  How often do you need to have someone help you when you read instructions, pamphlets, or other written materials from your doctor or pharmacy?: 1 - Never   Interpreter Needed?: No    Activities of Daily Living    03/27/2022    2:08 PM 10/05/2021    8:54 AM  In your present state of health, do you have any difficulty performing the following activities:  Hearing? 0 0  Vision? 0 0  Difficulty concentrating or making decisions? 0 0  Walking or climbing stairs? 0 0  Dressing or bathing? 0 0  Doing errands, shopping? 0 0  Preparing Food and eating ? N   Using the Toilet? N   In the past six months, have you accidently leaked urine? N   Do you have problems with loss of bowel control? N   Managing your Medications? N   Managing your Finances? N   Housekeeping or managing your Housekeeping? N    Patient Care Team: Crecencio Mc, MD as PCP - General (Internal Medicine)  Indicate any recent Medical Services you may have received from other than [facility] providers in the past year (date may be approximate).     Assessment:   This is a routine wellness examination for Kimberly Gill.  Virtual Visit via Telephone Note  I connected with  Kimberly Gill on 03/27/22 at  2:00 PM EDT by telephone and verified that I am speaking with the correct person using two identifiers.  Location: Patient: home Provider: office Persons participating in the virtual visit: patient/Nurse Health Advisor   I discussed the limitations of performing an evaluation and management service by telehealth. We continued and completed visit with audio only. Some vital signs may be absent or patient reported.   Hearing/Vision screen Hearing Screening - Comments:: Patient is able to hear conversational tones without difficulty.  No issues reported.  Vision  Screening - Comments:: Followed by Dr. [name]) Wears corrective lenses for reading only  They have regular follow up with the ophthalmologist  Dietary issues and exercise activities discussed: Current Exercise Habits: Home exercise routine, Type of exercise: yoga, Time (Minutes): 60, Frequency (Times/Week): 3, Weekly Exercise (Minutes/Week): 180, Intensity: Mild Healthy diet Good water intake   Goals Addressed               This Visit's Progress     Patient Stated     Increase physical activity (pt-stated)        I would like to walk more for  exercise.      Other     Increase water intake         Depression Screen    03/27/2022    2:14 PM 10/05/2021    8:54 AM 05/02/2021    4:49 PM 03/30/2021    9:09 AM 12/20/2020    2:40 PM 09/29/2020   11:33 AM 03/11/2020    2:27 PM  PHQ 2/9 Scores  PHQ - 2 Score 0 0 0 0 0 0 0  PHQ- 9 Score     1      Fall Risk    10/05/2021    8:54 AM 05/02/2021    4:49 PM 03/30/2021    9:08 AM 12/20/2020    2:39 PM 09/29/2020   11:33 AM  Fall Risk   Falls in the past year? 0 0 0 0 0  Number falls in past yr:     0  Injury with Fall?     0  Risk for fall due to : No Fall Risks      Follow up Falls evaluation completed Falls evaluation completed Falls evaluation completed Falls evaluation completed Falls evaluation completed   [place]: Home free of loose throw rugs in walkways, pet beds, electrical cords, etc? Yes  Adequate lighting in your home to reduce risk of falls? Yes   ASSISTIVE DEVICES UTILIZED TO PREVENT FALLS: Life alert? No  Use of a cane, walker or w/c? No   TIMED UP AND GO: Was the test performed? No .   Cognitive Function: Patient is alert and oriented x3.     04/24/2017    9:59 AM  MMSE - Mini Mental State Exam  Orientation to time 5  Orientation to Place 5  Registration 3  Attention/ Calculation 5  Recall 3  Language- name 2 objects 2  Language- repeat 1  Language- follow 3  step command 3  Language- read & follow direction 1  Write a sentence 1  Copy design 1  Total score 30       Immunizations Immunization History  Administered Date(s) Administered   Fluad Quad(high Dose 65+) 05/22/2019   Influenza, High Dose Seasonal PF 04/19/2016, 05/30/2018   Influenza,inj,Quad PF,6+ Mos 04/16/2014, 04/20/2015, 04/24/2017   Influenza-Unspecified 06/20/2020, 06/20/2021   PFIZER(Purple Top)SARS-COV-2 Vaccination 09/25/2019, 10/20/2019, 05/20/2020   PNEUMOCOCCAL CONJUGATE-20 10/05/2021   Pneumococcal Conjugate-13 04/20/2015   Pneumococcal Polysaccharide-23 11/13/2016   Tdap 03/14/2012   Zoster, Live 02/13/2012   TDAP status: Due, Education has been provided regarding the importance of this vaccine. Advised may receive this vaccine at local pharmacy or Health Dept. Aware to provide a copy of the vaccination record if obtained from local pharmacy or Health Dept. Verbalized acceptance and understanding.   Shingrix Completed?: No.    Education has been provided regarding the importance of this vaccine. Patient has been advised to call insurance company to determine out of pocket expense if they have not yet received this vaccine. Advised may also receive vaccine at local pharmacy or Health Dept. Verbalized acceptance and understanding.  Screening Tests Health Maintenance  Topic Date Due   MAMMOGRAM  03/01/2022   INFLUENZA VACCINE  03/20/2022   COVID-19 Vaccine (4 - Pfizer series) 04/12/2022 (Originally 07/15/2020)   Zoster Vaccines- Shingrix (1 of 2) 04/20/2022 (Originally 02/07/2000)   TETANUS/TDAP  05/20/2022 (Originally 03/14/2022)   COLONOSCOPY (Pts 45-43yr Insurance coverage will need to be confirmed)  07/24/2026   Pneumonia Vaccine 71 Years old  Completed  DEXA SCAN  Completed   Hepatitis C Screening  Completed   HPV VACCINES  Aged Out   Health Maintenance Health Maintenance Due  Topic Date Due   MAMMOGRAM  03/01/2022   INFLUENZA VACCINE  03/20/2022    Mammogram- agrees to schedule with [facility]. Notes she has the phone number for scheduling. [phone].  Lung Cancer Screening: (Low Dose CT Chest recommended if Age 22-80 years, 30 pack-year currently smoking OR have quit w/in 15years.) does not qualify.   Vision Screening: Recommended annual ophthalmology exams for early detection of glaucoma and other disorders of the eye.  Dental Screening: Recommended annual dental exams for proper oral hygiene  Community Resource Referral / Chronic Care Management: CRR required this visit?  No   CCM required this visit?  No      Plan:   Keep all routine maintenance appointments.   I have personally reviewed and noted the following in the patient's chart:   Medical and social history Use of alcohol, tobacco or illicit drugs  Current medications and supplements including opioid prescriptions.  Functional ability and status Nutritional status Physical activity Advanced directives List of other physicians Hospitalizations, surgeries, and ER visits in previous 12 months Vitals Screenings to include cognitive, depression, and falls Referrals and appointments  In addition, I have reviewed and discussed with patient certain preventive protocols, quality metrics, and best practice recommendations. A written personalized care plan for preventive services as well as general preventive health recommendations were provided to patient.     OBrien-Blaney, Macio Kissoon L, LPN   08/20/6577     I have reviewed the above information and agree with above.   Kimberly Medina, MD

## 2022-03-28 ENCOUNTER — Ambulatory Visit
Admission: RE | Admit: 2022-03-28 | Discharge: 2022-03-28 | Disposition: A | Discharge status: Home / Self Care | Payer: Medicare Other | Source: Ambulatory Visit | Attending: Family Medicine | Admitting: Family Medicine

## 2022-03-28 ENCOUNTER — Other Ambulatory Visit: Payer: Self-pay

## 2022-03-28 VITALS — BP 149/79 | HR 69 | Temp 98.4°F | Resp 16

## 2022-03-28 DIAGNOSIS — R300 Dysuria: Secondary | ICD-10-CM | POA: Insufficient documentation

## 2022-03-28 DIAGNOSIS — N3000 Acute cystitis without hematuria: Secondary | ICD-10-CM | POA: Insufficient documentation

## 2022-03-28 LAB — POCT URINALYSIS DIP (MANUAL ENTRY)
Bilirubin, UA: NEGATIVE
Blood, UA: NEGATIVE
Glucose, UA: NEGATIVE mg/dL
Ketones, POC UA: NEGATIVE mg/dL
Nitrite, UA: NEGATIVE
Protein Ur, POC: NEGATIVE mg/dL
Spec Grav, UA: 1.015 (ref 1.010–1.025)
Urobilinogen, UA: 0.2 E.U./dL
pH, UA: 8.5 — AB (ref 5.0–8.0)

## 2022-03-28 MED ORDER — NITROFURANTOIN MONOHYD MACRO 100 MG PO CAPS: 100.0000 mg | ORAL_CAPSULE | Freq: Two times a day (BID) | ORAL | 0 refills | Status: DC

## 2022-03-28 NOTE — ED Provider Notes (Signed)
Kimberly Gill    CSN: [id number] Arrival date & time: 03/28/22  1101      History   Chief Complaint Chief Complaint  Patient presents with   Urinary Frequency    Burning when urinating. Back ache. Fatigue. - Entered by patient   fequency   Dysuria   Back Pain    HPI Kimberly Gill is a 71 y.o. female.   HPI Kimberly Gill is a 71 y.o. female presents for evaluation of urinary frequency, urgency and dysuria x 7 days, with intermittent flank pain, but denies fever, chills, or abnormal vaginal discharge or bleeding. No history recurrent or chronic urine infection.  No LMP recorded. Patient is postmenopausal.   Past Medical History:  Diagnosis Date   Acute COVID-19 05/02/2021    Patient Active Problem List   Diagnosis Date Noted   Simple hepatic cyst 10/05/2021   Essential hypertension 10/31/2020   Postprandial RUQ pain 10/01/2020   Fatigue 10/01/2020   Osteopenia after menopause 06/10/2019   Insomnia 04/25/2017   Postmenopausal atrophic vaginitis 04/14/2013   Medicare annual wellness visit, subsequent 04/07/2012   Encounter for preventive health examination 04/07/2012   GERD (gastroesophageal reflux disease) 12/09/2011    Past Surgical History:  Procedure Laterality Date   BREAST BIOPSY Left 09/09/2009   neg   BUNIONECTOMY      OB History   No obstetric history on file.      Home Medications    Prior to Admission medications   Medication Sig Start Date End Date Taking? Authorizing Provider  nitrofurantoin, macrocrystal-monohydrate, (MACROBID) 100 MG capsule Take 1 capsule (100 mg total) by mouth 2 (two) times daily. 03/28/22  Yes Scot Jun, FNP  amLODipine (NORVASC) 2.5 MG tablet TAKE 1 TABLET BY MOUTH EVERY DAY 09/14/21   Crecencio Mc, MD  calcium citrate-vitamin D (CITRACAL+D) 315-200 MG-UNIT per tablet Take 1 tablet by mouth 2 (two) times daily.    [provider]  Multiple Vitamin (MULTIVITAMIN) tablet Take 1 tablet  by mouth daily.    [provider]  zolpidem (AMBIEN) 10 MG tablet TAKE 1 TABLET BY MOUTH EVERY DAY AT BEDTIME AS NEEDED 10/18/21   Crecencio Mc, MD    Family History Family History  Problem Relation Age of Onset   Hypertension Mother    Osteoporosis Mother    Congestive Heart Failure Mother    Hypertension Father    Mental retardation Father        dementia   Congestive Heart Failure Father    Alcohol abuse Sister    Mental illness Sister    Hypertension Sister    Breast cancer Neg Hx     Social History Social History   Tobacco Use   Smoking status: Never   Smokeless tobacco: Never  Substance Use Topics   Alcohol use: Yes   Drug use: No     Allergies   Sulfa antibiotics, Penicillins, and Sulfa drugs cross reactors   Review of Systems Review of Systems Pertinent negatives listed in HPI   Physical Exam Triage Vital Signs ED Triage Vitals  Enc Vitals Group     BP 03/28/22 1130 (!) 149/79     Pulse Rate 03/28/22 1130 69     Resp 03/28/22 1130 16     Temp 03/28/22 1130 98.4 F (36.9 C)     Temp Source 03/28/22 1130 Oral     SpO2 03/28/22 1130 98 %     Weight --  Height --      Head Circumference --      Peak Flow --      Pain Score 03/28/22 1142 5     Pain Loc --      Pain Edu? --      Excl. in [place]? --    No data found.  Updated Vital Signs BP (!) 149/79 (BP Location: Left Arm)   Pulse 69   Temp 98.4 F (36.9 C) (Oral)   Resp 16   SpO2 98%   Visual Acuity Right Eye Distance:   Left Eye Distance:   Bilateral Distance:    Right Eye Near:   Left Eye Near:    Bilateral Near:     Physical Exam  General appearance: Alert, well developed, well nourished, cooperative  Head: Normocephalic, without obvious abnormality, atraumatic Heart: Rate and rhythm normal.   Respiratory: Respirations even and unlabored, normal respiratory rate CVA: + Left flank pain  Extremities: No gross deformities Skin: Skin color, texture, turgor normal. No  rashes seen  Psych: Appropriate mood and affect.  UC Treatments / Results  Labs (all labs ordered are listed, but only abnormal results are displayed) Labs Reviewed  POCT URINALYSIS DIP (MANUAL ENTRY) - Abnormal; Notable for the following components:      Result Value   Clarity, UA cloudy (*)    pH, UA 8.5 (*)    Leukocytes, UA Trace (*)    All other components within normal limits  URINE CULTURE    EKG   Radiology No results found.  Procedures Procedures (including critical care time)  Medications Ordered in UC Medications - No data to display  Initial Impression / Assessment and Plan / UC Course  I have reviewed the triage vital signs and the nursing notes.  Pertinent labs & imaging results that were available during my care of the patient were reviewed by me and considered in my medical decision making (see chart for details).      UA abnormal and findings consistent with UTI. Empiric antibiotic treatment initiated. Encouraged increase intake of water.  Urine culture pending.  ER if symptoms become severe. Follow-up with PCP if symptoms do not completely resolve.  Final Clinical Impressions(s) / UC Diagnoses   Final diagnoses:  Dysuria  Acute cystitis without hematuria     Discharge Instructions      Your urine culture will result within 3 days.  We will notify you if there is any changes in treatment based on urine culture results.  If you do not hear anything from our clinic continue with current treatment as prescribed.  Hydrate well with fluids.  If you develop any fever, nausea or vomiting or severe abdominal pain go immediately to the emergency department.      ED Prescriptions     Medication Sig Dispense Auth. Provider   nitrofurantoin, macrocrystal-monohydrate, (MACROBID) 100 MG capsule Take 1 capsule (100 mg total) by mouth 2 (two) times daily. 10 capsule Scot Jun, FNP      PDMP not reviewed this encounter.   Scot Jun,  FNP 03/28/22 1306

## 2022-03-28 NOTE — Discharge Instructions (Signed)
Your urine culture will result within 3 days.  We will notify you if there is any changes in treatment based on urine culture results.  If you do not hear anything from our clinic continue with current treatment as prescribed.  Hydrate well with fluids.  If you develop any fever, nausea or vomiting or severe abdominal pain go immediately to the emergency department.

## 2022-03-28 NOTE — ED Triage Notes (Signed)
PT reports frequency,dysuria and back pain for one week.

## 2022-03-29 LAB — URINE CULTURE
Culture: NO GROWTH
Special Requests: NORMAL

## 2022-04-05 ENCOUNTER — Ambulatory Visit (INDEPENDENT_AMBULATORY_CARE_PROVIDER_SITE_OTHER): Payer: Medicare Other | Admitting: Internal Medicine

## 2022-04-05 ENCOUNTER — Encounter: Payer: Self-pay | Admitting: Internal Medicine

## 2022-04-05 VITALS — BP 120/84 | HR 69 | Temp 98.0°F | Ht 65.0 in | Wt 136.0 lb

## 2022-04-05 DIAGNOSIS — E785 Hyperlipidemia, unspecified: Secondary | ICD-10-CM | POA: Diagnosis not present

## 2022-04-05 DIAGNOSIS — F5101 Primary insomnia: Secondary | ICD-10-CM

## 2022-04-05 DIAGNOSIS — N952 Postmenopausal atrophic vaginitis: Secondary | ICD-10-CM

## 2022-04-05 DIAGNOSIS — R7301 Impaired fasting glucose: Secondary | ICD-10-CM | POA: Diagnosis not present

## 2022-04-05 DIAGNOSIS — Z0000 Encounter for general adult medical examination without abnormal findings: Secondary | ICD-10-CM

## 2022-04-05 DIAGNOSIS — I10 Essential (primary) hypertension: Secondary | ICD-10-CM | POA: Diagnosis not present

## 2022-04-05 DIAGNOSIS — R5383 Other fatigue: Secondary | ICD-10-CM

## 2022-04-05 LAB — CBC WITH DIFFERENTIAL/PLATELET
Basophils Absolute: 0.1 10*3/uL (ref 0.0–0.1)
Basophils Relative: 1.5 % (ref 0.0–3.0)
Eosinophils Absolute: 0.1 10*3/uL (ref 0.0–0.7)
Eosinophils Relative: 1.8 % (ref 0.0–5.0)
HCT: 39.8 % (ref 36.0–46.0)
Hemoglobin: 13 g/dL (ref 12.0–15.0)
Lymphocytes Relative: 41.2 % (ref 12.0–46.0)
Lymphs Abs: 1.6 10*3/uL (ref 0.7–4.0)
MCHC: 32.6 g/dL (ref 30.0–36.0)
MCV: 93.8 fl (ref 78.0–100.0)
Monocytes Absolute: 0.4 10*3/uL (ref 0.1–1.0)
Monocytes Relative: 10.9 % (ref 3.0–12.0)
Neutro Abs: 1.7 10*3/uL (ref 1.4–7.7)
Neutrophils Relative %: 44.6 % (ref 43.0–77.0)
Platelets: 231 10*3/uL (ref 150.0–400.0)
RBC: 4.24 Mil/uL (ref 3.87–5.11)
RDW: 14.3 % (ref 11.5–15.5)
WBC: 3.9 10*3/uL — ABNORMAL LOW (ref 4.0–10.5)

## 2022-04-05 LAB — LIPID PANEL
Cholesterol: 229 mg/dL — ABNORMAL HIGH (ref 0–200)
HDL: 99.3 mg/dL (ref >39.00)
LDL Cholesterol: 118 mg/dL — ABNORMAL HIGH (ref 0–99)
NonHDL: 129.97
Total CHOL/HDL Ratio: 2
Triglycerides: 60 mg/dL (ref 0.0–149.0)
VLDL: 12 mg/dL (ref 0.0–40.0)

## 2022-04-05 LAB — COMPREHENSIVE METABOLIC PANEL
ALT: 12 U/L (ref 0–35)
AST: 20 U/L (ref 0–37)
Albumin: 4.4 g/dL (ref 3.5–5.2)
Alkaline Phosphatase: 50 U/L (ref 39–117)
BUN: 10 mg/dL (ref 6–23)
CO2: 27 mEq/L (ref 19–32)
Calcium: 9.3 mg/dL (ref 8.4–10.5)
Chloride: 101 mEq/L (ref 96–112)
Creatinine, Ser: 0.78 mg/dL (ref 0.40–1.20)
GFR: 76.02 mL/min (ref >60.00)
Glucose, Bld: 86 mg/dL (ref 70–99)
Potassium: 4 mEq/L (ref 3.5–5.1)
Sodium: 137 mEq/L (ref 135–145)
Total Bilirubin: 0.7 mg/dL (ref 0.2–1.2)
Total Protein: 6.9 g/dL (ref 6.0–8.3)

## 2022-04-05 LAB — HEMOGLOBIN A1C: Hgb A1c MFr Bld: 6 % (ref 4.6–6.5)

## 2022-04-05 LAB — TSH: TSH: 4 u[IU]/mL (ref 0.35–5.50)

## 2022-04-05 MED ORDER — ZOLPIDEM TARTRATE 10 MG PO TABS
ORAL_TABLET | ORAL | 5 refills | Status: DC
Start: 2022-04-05 — End: 2022-10-08

## 2022-04-05 MED ORDER — ESTRADIOL 0.1 MG/GM VA CREA: TOPICAL_CREAM | VAGINAL | 12 refills | Status: DC

## 2022-04-05 NOTE — Patient Instructions (Addendum)
You did not have a UTI last week.  The culture was negative   the symptoms were likely due to irritation complicated by "atrophic vaginitis"  I recommend using the estrogen cream (" dab will do you" )  around the urethra ,  use it every night for 2 weeks,  then  twice weekly thereafter.

## 2022-04-05 NOTE — Progress Notes (Signed)
Subjective:  Patient ID: Kimberly Gill, female    DOB: 02-04-1951  Age: 71 y.o. MRN: [id number]  CC: The primary encounter diagnosis was Essential hypertension. Diagnoses of Fatigue, unspecified type, Hyperlipidemia, unspecified hyperlipidemia type, Impaired fasting glucose, Encounter for preventive health examination, Primary insomnia, Caregiver with fatigue, and Postmenopausal atrophic vaginitis were also pertinent to this visit.   HPI Kimberly Gill presents for follow up on hypertension and insomnia Chief Complaint  Patient presents with   Follow-up    6 month follow up on hypertension and medication refills    1) HTN: Hypertension: patient checks blood pressure twice weekly at home.  Readings have been for the most part < 140/80 at rest . Patient is following a reduce salt diet most days and is taking medications as prescribed    2) Insomnia using ambien 10 mg   3) Dysuria: reviewed last weeks e visit    Outpatient Medications Prior to Visit  Medication Sig Dispense Refill   amLODipine (NORVASC) 2.5 MG tablet TAKE 1 TABLET BY MOUTH EVERY DAY 90 tablet 3   calcium citrate-vitamin D (CITRACAL+D) 315-200 MG-UNIT per tablet Take 1 tablet by mouth 2 (two) times daily.     Multiple Vitamin (MULTIVITAMIN) tablet Take 1 tablet by mouth daily.     zolpidem (AMBIEN) 10 MG tablet TAKE 1 TABLET BY MOUTH EVERY DAY AT BEDTIME AS NEEDED 30 tablet 5   nitrofurantoin, macrocrystal-monohydrate, (MACROBID) 100 MG capsule Take 1 capsule (100 mg total) by mouth 2 (two) times daily. (Patient not taking: Reported on 04/05/2022) 10 capsule 0   No facility-administered medications prior to visit.    Review of Systems;  Patient denies headache, fevers, malaise, unintentional weight loss, skin rash, eye pain, sinus congestion and sinus pain, sore throat, dysphagia,  hemoptysis , cough, dyspnea, wheezing, chest pain, palpitations, orthopnea, edema, abdominal pain, nausea, melena, diarrhea,  constipation, flank pain, dysuria, hematuria, urinary  Frequency, nocturia, numbness, tingling, seizures,  Focal weakness, Loss of consciousness,  Tremor, insomnia, depression, anxiety, and suicidal ideation.      Objective:  BP 120/84 (BP Location: Left Arm, Patient Position: Sitting, Cuff Size: Normal)   Pulse 69   Temp 98 F (36.7 C) (Oral)   Ht _0  (1.651 m)   Wt 136 lb (61.7 kg)   SpO2 98%   BMI 22.63 kg/m   BP Readings from Last 3 Encounters:  04/05/22 120/84  03/28/22 (!) 149/79  10/05/21 (!) 142/72    Wt Readings from Last 3 Encounters:  04/05/22 136 lb (61.7 kg)  03/27/22 138 lb (62.6 kg)  10/05/21 138 lb 6.4 oz (62.8 kg)    General appearance: alert, cooperative and appears stated age Ears: normal TM's and external ear canals both ears Throat: lips, mucosa, and tongue normal; teeth and gums normal Neck: no adenopathy, no carotid bruit, supple, symmetrical, trachea midline and thyroid not enlarged, symmetric, no tenderness/mass/nodules Back: symmetric, no curvature. ROM normal. No CVA tenderness. Lungs: clear to auscultation bilaterally Heart: regular rate and rhythm, S1, S2 normal, no murmur, click, rub or gallop Abdomen: soft, non-tender; bowel sounds normal; no masses,  no organomegaly Pulses: 2+ and symmetric Skin: Skin color, texture, turgor normal. No rashes or lesions Lymph nodes: Cervical, supraclavicular, and axillary nodes normal.  Lab Results  Component Value Date   HGBA1C 6.0 04/05/2022    Lab Results  Component Value Date   CREATININE 0.78 04/05/2022   CREATININE 0.76 10/05/2021   CREATININE 0.76 03/30/2021    Lab Results  Component Value Date   WBC 3.9 (L) 04/05/2022   HGB 13.0 04/05/2022   HCT 39.8 04/05/2022   PLT 231.0 04/05/2022   GLUCOSE 86 04/05/2022   CHOL 229 (H) 04/05/2022   TRIG 60.0 04/05/2022   HDL 99.30 04/05/2022   LDLDIRECT 113 04/19/2016   LDLCALC 118 (H) 04/05/2022   ALT 12 04/05/2022   AST 20 04/05/2022   NA  137 04/05/2022   K 4.0 04/05/2022   CL 101 04/05/2022   CREATININE 0.78 04/05/2022   BUN 10 04/05/2022   CO2 27 04/05/2022   TSH 4.00 04/05/2022   HGBA1C 6.0 04/05/2022   MICROALBUR <0.7 10/05/2021    No results found.  Assessment & Plan:   Problem List Items Addressed This Visit     Caregiver with fatigue    counselling given regarding her management of personal needs,  fatigue and stress related to care of husband who is 11 yrs older than her and requiring increased care       Encounter for preventive health examination    Well controlled on current regimen. Renal function stable, no changes today.      Essential hypertension - Primary   Relevant Orders   Comp Met (CMET) (Completed)   Insomnia    She has tried the natural remedies for insomnia including herbal tea and melatonin, and adheres to the principles of good sleep hygiene.  Trial of lunesta was also not helpful.  I have reviewed the dangers of prescribing ambien to the elderly with patient.  She prefers to continue using [place]  .  Refills given       Postmenopausal atrophic vaginitis    with recent E visit for dysuria.  encouraged to use estrogen topical ointment .       Other Visit Diagnoses     Hyperlipidemia, unspecified hyperlipidemia type       Relevant Orders   Lipid Profile (Completed)   Impaired fasting glucose       Relevant Orders   HgB A1c (Completed)       I spent a total of 32  minutes with this patient in a face to face visit on the date of this encounter reviewing the last office visit with me, her  most recent E visit for dysuria. patient'ss diet and eating habits, home blood pressure readings ,  curret stress level , and post visit ordering of testing and therapeutics.    Follow-up: Return in about 6 months (around 10/06/2022).   Crecencio Mc, MD

## 2022-04-07 NOTE — Assessment & Plan Note (Signed)
counselling given regarding her management of personal needs,  fatigue and stress related to care of husband who is 11 yrs older than her and requiring increased care

## 2022-04-07 NOTE — Assessment & Plan Note (Signed)
Well controlled on current regimen. Renal function stable, no changes today. 

## 2022-04-07 NOTE — Assessment & Plan Note (Signed)
She has tried the natural remedies for insomnia including herbal tea and melatonin, and adheres to the principles of good sleep hygiene.  Trial of lunesta was also not helpful.  I have reviewed the dangers of prescribing ambien to the elderly with patient.  She prefers to continue using ambien  .  Refills given  

## 2022-04-07 NOTE — Assessment & Plan Note (Signed)
with recent E visit for dysuria.  encouraged to use estrogen topical ointment .

## 2022-05-01 ENCOUNTER — Ambulatory Visit
Admission: RE | Admit: 2022-05-01 | Discharge: 2022-05-01 | Disposition: A | Discharge status: Home / Self Care | Payer: Medicare Other | Source: Ambulatory Visit | Attending: Internal Medicine | Admitting: Internal Medicine

## 2022-05-01 DIAGNOSIS — Z780 Asymptomatic menopausal state: Secondary | ICD-10-CM | POA: Insufficient documentation

## 2022-05-01 DIAGNOSIS — Z1231 Encounter for screening mammogram for malignant neoplasm of breast: Secondary | ICD-10-CM | POA: Diagnosis not present

## 2022-05-01 DIAGNOSIS — M8589 Other specified disorders of bone density and structure, multiple sites: Secondary | ICD-10-CM | POA: Diagnosis not present

## 2022-05-01 DIAGNOSIS — M8580 Other specified disorders of bone density and structure, unspecified site: Secondary | ICD-10-CM | POA: Insufficient documentation

## 2022-05-02 NOTE — Assessment & Plan Note (Signed)
Repeat DEXA 2023 notes  Unchanged  Tscores of -2.3, but her risk of fracture is 24%.  Recommending alendronate

## 2022-05-11 ENCOUNTER — Telehealth: Payer: Self-pay | Admitting: *Deleted

## 2022-05-11 NOTE — Patient Outreach (Signed)
  Care Coordination   05/11/2022 Name: Kimberly Gill MRN: [id number] DOB: Jun 02, 1951   Care Coordination Outreach Attempts:  An unsuccessful telephone outreach was attempted today to offer the patient information about available care coordination services as a benefit of their health plan.   Follow Up Plan:  Additional outreach attempts will be made to offer the patient care coordination information and services.   Encounter Outcome:  No Answer  Care Coordination Interventions Activated:  Yes   Care Coordination Interventions:  No, not indicated    [place] [facility] [phone]

## 2022-05-30 DIAGNOSIS — Z23 Encounter for immunization: Secondary | ICD-10-CM | POA: Diagnosis not present

## 2022-06-21 DIAGNOSIS — L821 Other seborrheic keratosis: Secondary | ICD-10-CM | POA: Diagnosis not present

## 2022-06-21 DIAGNOSIS — D2262 Melanocytic nevi of left upper limb, including shoulder: Secondary | ICD-10-CM | POA: Diagnosis not present

## 2022-06-21 DIAGNOSIS — D225 Melanocytic nevi of trunk: Secondary | ICD-10-CM | POA: Diagnosis not present

## 2022-06-21 DIAGNOSIS — D2271 Melanocytic nevi of right lower limb, including hip: Secondary | ICD-10-CM | POA: Diagnosis not present

## 2022-06-21 DIAGNOSIS — D2272 Melanocytic nevi of left lower limb, including hip: Secondary | ICD-10-CM | POA: Diagnosis not present

## 2022-06-21 DIAGNOSIS — D2261 Melanocytic nevi of right upper limb, including shoulder: Secondary | ICD-10-CM | POA: Diagnosis not present

## 2022-09-18 ENCOUNTER — Telehealth: Payer: Self-pay

## 2022-09-18 NOTE — Patient Outreach (Signed)
  Care Coordination   09/18/2022 Name: Kimberly Gill MRN: [id number] DOB: 10-03-1950   Care Coordination Outreach Attempts:  A second unsuccessful outreach was attempted today to offer the patient with information about available care coordination services as a benefit of their health plan.     Follow Up Plan:  Additional outreach attempts will be made to offer the patient care coordination information and services.   Encounter Outcome:  No Answer   Care Coordination Interventions:  No, not indicated    Quinn Plowman [facility] [place] [phone] direct line

## 2022-10-08 ENCOUNTER — Ambulatory Visit (INDEPENDENT_AMBULATORY_CARE_PROVIDER_SITE_OTHER): Payer: Medicare HMO | Admitting: Internal Medicine

## 2022-10-08 ENCOUNTER — Encounter: Payer: Self-pay | Admitting: Internal Medicine

## 2022-10-08 VITALS — BP 120/70 | HR 73 | Temp 97.6°F | Resp 15 | Ht 65.0 in | Wt 139.0 lb

## 2022-10-08 DIAGNOSIS — I10 Essential (primary) hypertension: Secondary | ICD-10-CM

## 2022-10-08 DIAGNOSIS — E785 Hyperlipidemia, unspecified: Secondary | ICD-10-CM

## 2022-10-08 DIAGNOSIS — R5383 Other fatigue: Secondary | ICD-10-CM | POA: Diagnosis not present

## 2022-10-08 DIAGNOSIS — F5101 Primary insomnia: Secondary | ICD-10-CM

## 2022-10-08 DIAGNOSIS — R69 Illness, unspecified: Secondary | ICD-10-CM | POA: Diagnosis not present

## 2022-10-08 DIAGNOSIS — E559 Vitamin D deficiency, unspecified: Secondary | ICD-10-CM

## 2022-10-08 DIAGNOSIS — M8580 Other specified disorders of bone density and structure, unspecified site: Secondary | ICD-10-CM

## 2022-10-08 DIAGNOSIS — R7301 Impaired fasting glucose: Secondary | ICD-10-CM | POA: Diagnosis not present

## 2022-10-08 DIAGNOSIS — Z780 Asymptomatic menopausal state: Secondary | ICD-10-CM

## 2022-10-08 DIAGNOSIS — N952 Postmenopausal atrophic vaginitis: Secondary | ICD-10-CM

## 2022-10-08 LAB — TSH: TSH: 4.71 u[IU]/mL (ref 0.35–5.50)

## 2022-10-08 LAB — COMPREHENSIVE METABOLIC PANEL
ALT: 11 U/L (ref 0–35)
AST: 19 U/L (ref 0–37)
Albumin: 4.1 g/dL (ref 3.5–5.2)
Alkaline Phosphatase: 53 U/L (ref 39–117)
BUN: 11 mg/dL (ref 6–23)
CO2: 28 mEq/L (ref 19–32)
Calcium: 9.5 mg/dL (ref 8.4–10.5)
Chloride: 102 mEq/L (ref 96–112)
Creatinine, Ser: 0.83 mg/dL (ref 0.40–1.20)
GFR: 70.31 mL/min (ref >60.00)
Glucose, Bld: 91 mg/dL (ref 70–99)
Potassium: 4.2 mEq/L (ref 3.5–5.1)
Sodium: 137 mEq/L (ref 135–145)
Total Bilirubin: 0.7 mg/dL (ref 0.2–1.2)
Total Protein: 6.9 g/dL (ref 6.0–8.3)

## 2022-10-08 LAB — CBC WITH DIFFERENTIAL/PLATELET
Basophils Absolute: 0.1 10*3/uL (ref 0.0–0.1)
Basophils Relative: 1.6 % (ref 0.0–3.0)
Eosinophils Absolute: 0.1 10*3/uL (ref 0.0–0.7)
Eosinophils Relative: 2.2 % (ref 0.0–5.0)
HCT: 39.8 % (ref 36.0–46.0)
Hemoglobin: 13.2 g/dL (ref 12.0–15.0)
Lymphocytes Relative: 36.9 % (ref 12.0–46.0)
Lymphs Abs: 1.6 10*3/uL (ref 0.7–4.0)
MCHC: 33.2 g/dL (ref 30.0–36.0)
MCV: 93.3 fl (ref 78.0–100.0)
Monocytes Absolute: 0.4 10*3/uL (ref 0.1–1.0)
Monocytes Relative: 10.4 % (ref 3.0–12.0)
Neutro Abs: 2.1 10*3/uL (ref 1.4–7.7)
Neutrophils Relative %: 48.9 % (ref 43.0–77.0)
Platelets: 266 10*3/uL (ref 150.0–400.0)
RBC: 4.27 Mil/uL (ref 3.87–5.11)
RDW: 14.1 % (ref 11.5–15.5)
WBC: 4.3 10*3/uL (ref 4.0–10.5)

## 2022-10-08 LAB — LIPID PANEL
Cholesterol: 232 mg/dL — ABNORMAL HIGH (ref 0–200)
HDL: 102.8 mg/dL (ref >39.00)
LDL Cholesterol: 115 mg/dL — ABNORMAL HIGH (ref 0–99)
NonHDL: 128.85
Total CHOL/HDL Ratio: 2
Triglycerides: 71 mg/dL (ref 0.0–149.0)
VLDL: 14.2 mg/dL (ref 0.0–40.0)

## 2022-10-08 LAB — VITAMIN D 25 HYDROXY (VIT D DEFICIENCY, FRACTURES): VITD: 26.44 ng/mL — ABNORMAL LOW (ref 30.00–100.00)

## 2022-10-08 LAB — HEMOGLOBIN A1C: Hgb A1c MFr Bld: 5.7 % (ref 4.6–6.5)

## 2022-10-08 MED ORDER — ALENDRONATE SODIUM 70 MG PO TABS: 70.0000 mg | ORAL_TABLET | ORAL | 11 refills | Status: DC

## 2022-10-08 MED ORDER — AMLODIPINE BESYLATE 2.5 MG PO TABS: 2.5000 mg | ORAL_TABLET | Freq: Every day | ORAL | 3 refills | Status: DC

## 2022-10-08 MED ORDER — ZOLPIDEM TARTRATE 10 MG PO TABS
ORAL_TABLET | ORAL | 5 refills | Status: DC
Start: 2022-10-08 — End: 2022-12-19

## 2022-10-08 NOTE — Assessment & Plan Note (Signed)
Continue maintenace dose of vaginal estrogen

## 2022-10-08 NOTE — Progress Notes (Signed)
Subjective:  Patient ID: Kimberly Gill, female    DOB: December 01, 1950  Age: 72 y.o. MRN: [id number]  CC: The primary encounter diagnosis was Essential hypertension. Diagnoses of Fatigue, unspecified type, Hyperlipidemia, unspecified hyperlipidemia type, Impaired fasting glucose, Vitamin D deficiency, Osteopenia after menopause, Primary insomnia, and Postmenopausal atrophic vaginitis were also pertinent to this visit.   HPI Kimberly Gill presents for  Chief Complaint  Patient presents with   Medical Management of Chronic Issues   Hypertension   1) HTN:  Patient is taking her medications as prescribed and notes no adverse effects.  Home BP readings have been done about once per week and are  generally < 130/80 .  She is avoiding added salt in her diet and  participating in yoga  3 times per week for exercise  .   2) Osteopenia by Sept 2023 DEXA:  reviewed treatment options to start as her T score -2.3   3) Anxiety:  both grown children having medical issues  : daughter gaining weight  recurrent pseudotumor cerebri complicated by borderline personality.  Son having panic attacks and SVT .    Outpatient Medications Prior to Visit  Medication Sig Dispense Refill   calcium citrate-vitamin D (CITRACAL+D) 315-200 MG-UNIT per tablet Take 1 tablet by mouth 2 (two) times daily.     estradiol (ESTRACE VAGINAL) 0.1 MG/GM vaginal cream Apply small amount to urethral area, nightly for 2 weeks,  then twice weekly thereafter 42.5 g 12   Multiple Vitamin (MULTIVITAMIN) tablet Take 1 tablet by mouth daily.     amLODipine (NORVASC) 2.5 MG tablet TAKE 1 TABLET BY MOUTH EVERY DAY 90 tablet 3   zolpidem (AMBIEN) 10 MG tablet TAKE 1 TABLET BY MOUTH EVERY DAY AT BEDTIME AS NEEDED 30 tablet 5   No facility-administered medications prior to visit.    Review of Systems;  Patient denies headache, fevers, malaise, unintentional weight loss, skin rash, eye pain, sinus congestion and sinus pain, sore  throat, dysphagia,  hemoptysis , cough, dyspnea, wheezing, chest pain, palpitations, orthopnea, edema, abdominal pain, nausea, melena, diarrhea, constipation, flank pain, dysuria, hematuria, urinary  Frequency, nocturia, numbness, tingling, seizures,  Focal weakness, Loss of consciousness,  Tremor, insomnia, depression, anxiety, and suicidal ideation.      Objective:  BP 120/70   Pulse 73   Temp 97.6 F (36.4 C) (Oral)   Resp 15   Ht 5' 5"$  (1.651 m)   Wt 139 lb (63 kg)   SpO2 99%   BMI 23.13 kg/m   BP Readings from Last 3 Encounters:  10/08/22 120/70  04/05/22 120/84  03/28/22 (!) 149/79    Wt Readings from Last 3 Encounters:  10/08/22 139 lb (63 kg)  04/05/22 136 lb (61.7 kg)  03/27/22 138 lb (62.6 kg)    Physical Exam Vitals reviewed.  Constitutional:      General: She is not in acute distress.    Appearance: Normal appearance. She is normal weight. She is not ill-appearing, toxic-appearing or diaphoretic.  HENT:     Head: Normocephalic.  Eyes:     General: No scleral icterus.       Right eye: No discharge.        Left eye: No discharge.     Conjunctiva/sclera: Conjunctivae normal.  Cardiovascular:     Rate and Rhythm: Normal rate and regular rhythm.     Heart sounds: Normal heart sounds.  Pulmonary:     Effort: Pulmonary effort is normal. No respiratory distress.  Breath sounds: Normal breath sounds.  Musculoskeletal:        General: Normal range of motion.  Skin:    General: Skin is warm and dry.  Neurological:     General: No focal deficit present.     Mental Status: She is alert and oriented to person, place, and time. Mental status is at baseline.  Psychiatric:        Mood and Affect: Mood normal.        Behavior: Behavior normal.        Thought Content: Thought content normal.        Judgment: Judgment normal.    Lab Results  Component Value Date   HGBA1C 5.7 10/08/2022   HGBA1C 6.0 04/05/2022    Lab Results  Component Value Date    CREATININE 0.83 10/08/2022   CREATININE 0.78 04/05/2022   CREATININE 0.76 10/05/2021    Lab Results  Component Value Date   WBC 4.3 10/08/2022   HGB 13.2 10/08/2022   HCT 39.8 10/08/2022   PLT 266.0 10/08/2022   GLUCOSE 91 10/08/2022   CHOL 232 (H) 10/08/2022   TRIG 71.0 10/08/2022   HDL 102.80 10/08/2022   LDLDIRECT 113 04/19/2016   LDLCALC 115 (H) 10/08/2022   ALT 11 10/08/2022   AST 19 10/08/2022   NA 137 10/08/2022   K 4.2 10/08/2022   CL 102 10/08/2022   CREATININE 0.83 10/08/2022   BUN 11 10/08/2022   CO2 28 10/08/2022   TSH 4.71 10/08/2022   HGBA1C 5.7 10/08/2022   MICROALBUR <0.7 10/05/2021    MM 3D SCREEN BREAST BILATERAL  Result Date: 05/02/2022 CLINICAL DATA:  Screening. EXAM: DIGITAL SCREENING BILATERAL MAMMOGRAM WITH TOMOSYNTHESIS AND CAD TECHNIQUE: Bilateral screening digital craniocaudal and mediolateral oblique mammograms were obtained. Bilateral screening digital breast tomosynthesis was performed. The images were evaluated with computer-aided detection. COMPARISON:  Previous exam(s). ACR Breast Density Category c: The breast tissue is heterogeneously dense, which may obscure small masses. FINDINGS: There are no findings suspicious for malignancy. IMPRESSION: No mammographic evidence of malignancy. A result letter of this screening mammogram will be mailed directly to the patient. RECOMMENDATION: Screening mammogram in one year. (Code:SM-B-01Y) BI-RADS CATEGORY  1: Negative. Electronically Signed   By: Evangeline Dakin M.D.   On: 05/02/2022 15:41   DG Bone Density  Result Date: 05/01/2022 EXAM: DUAL X-RAY ABSORPTIOMETRY (DXA) FOR BONE MINERAL DENSITY IMPRESSION: Dear Dr. Derrel Nip, Your patient Kimberly Gill completed a FRAX assessment on 05/01/2022 using the [place] (analysis version: 14.10) manufactured by [employer]. The following summarizes the results of our evaluation. PATIENT BIOGRAPHICAL: Name: Kimberly Gill Patient ID: [id number] Birth  Date: 07-23-1951 Height:    65.0 in. Gender:     Female    [age]       Weight:    140.4 lbs. Ethnicity:  White                            Exam Date: 05/01/2022 FRAX* RESULTS:  (version: 3.5) 10-year Probability of Fracture1 Major Osteoporotic Fracture2 Hip Fracture 23.6% 10.8% Population: [place] (Caucasian) Risk Factors: Family Hist. (Parent hip fracture) Based on Femur (Left) Neck BMD 1 -The 10-year probability of fracture may be lower than reported if the patient has received treatment. 2 -Major Osteoporotic Fracture: Clinical Spine, Forearm, Hip or Shoulder *FRAX is a Materials engineer of the [employer] of [employer] for  Metabolic Bone Disease, a World Pharmacologist (WHO) [place]. ASSESSMENT: The probability of a major osteoporotic fracture is 23.6% within the next ten years. The probability of a hip fracture is 10.8% within the next ten years. I have reviewed this report and agree with the above findings. Mark A. Thornton Papas, M.D. [facility] Your patient Darrianna Blonder completed a BMD test on 05/01/2022 using the Bot[employer] (software version: 14.10) manufactured by [employer]. The following summarizes the results of our evaluation. Technologist: [facility] PATIENT BIOGRAPHICAL: Name: Rhona, Alsip Patient ID: [id number] Birth Date: October 24, 1950 Height: 65.0 in. Gender: Female Exam Date: 05/01/2022 Weight: 140.4 lbs. Indications: Advanced Age, Family Hist. (Parent hip fracture), Postmenopausal Fractures: Treatments: Calcium DENSITOMETRY RESULTS: Site      Region     Measured Date Measured Age WHO Classification Young Adult T-score BMD         %Change vs. Previous Significant Change (*) AP Spine L1-L4 (L2) 05/01/2022 72.2 Osteopenia -2.3 0.902 g/cm2 - - DualFemur Neck Left 05/01/2022 72.2 Osteopenia -2.3 0.711 g/cm2 - - ASSESSMENT: The BMD measured at AP Spine L1-L4 (L2) is 0.902 g/cm2 with a T-score of -2.3. This patient is considered osteopenic  according to [facility]) criteria. The scan quality is good. L-2 was excluded due to degenerative changes. World Pharmacologist [facility]) criteria for post-menopausal, Caucasian Women: Normal:                   T-score at or above -1 SD Osteopenia/low bone mass: T-score between -1 and -2.5 SD Osteoporosis:             T-score at or below -2.5 SD RECOMMENDATIONS: 1. All patients should optimize calcium and vitamin D intake. 2. Consider FDA-approved medical therapies in postmenopausal women and men aged 49 years and older, based on the following: a. A hip or vertebral(clinical or morphometric) fracture b. T-score < -2.5 at the femoral neck or spine after appropriate evaluation to exclude secondary causes c. Low bone mass (T-score between -1.0 and -2.5 at the femoral neck or spine) and a 10-year probability of a hip fracture > 3% or a 10-year probability of a major osteoporosis-related fracture > 20% based on the [place]-adapted WHO algorithm 3. Clinician judgment and/or patient preferences may indicate treatment for people with 10-year fracture probabilities above or below these levels FOLLOW-UP: People with diagnosed cases of osteoporosis or at high risk for fracture should have regular bone mineral density tests. For patients eligible for Medicare, routine testing is allowed once every 2 years. The testing frequency can be increased to one year for patients who have rapidly progressing disease, those who are receiving or discontinuing medical therapy to restore bone mass, or have additional risk factors. I have reviewed this report, and agree with the above findings. Mark A. Thornton Papas, M.D. [facility] Electronically Signed   By: Lavonia Dana M.D.   On: 05/01/2022 14:47    Assessment & Plan:  .Essential hypertension Assessment & Plan: Well controlled on current regimen.  Of amlodipine. , no changes today.   Orders: -     Comprehensive metabolic panel  Fatigue, unspecified type -      CBC with Differential/Platelet  Hyperlipidemia, unspecified hyperlipidemia type -     Lipid panel -     TSH  Impaired fasting glucose -     Hemoglobin A1c  Vitamin D deficiency -     VITAMIN D 25 Hydroxy (Vit-D Deficiency, Fractures)  Osteopenia after  menopause Assessment & Plan: Repeat DEXA 2023 notes  Unchanged  Tscores of -2.3, but her risk of fracture is 24%.  Risks nad benfits of pharmacothrerapy with alendronate and raloxifene reviewe ddwith patient today. Recommending alendronate which she has agreed to  .  Reviewed calcium and vitamin D needs    Primary insomnia Assessment & Plan: She has tried the natural remedies for insomnia including herbal tea and melatonin, and adheres to the principles of good sleep hygiene.  Trial of lunesta was also not helpful.  I have reviewed the dangers of prescribing ambien to the elderly with patient.  She prefers to continue using [place]  .  Refills given    Postmenopausal atrophic vaginitis Assessment & Plan: Continue maintenace dose of vaginal estrogen    Other orders -     amLODIPine Besylate; Take 1 tablet (2.5 mg total) by mouth daily.  Dispense: 90 tablet; Refill: 3 -     Zolpidem Tartrate; TAKE 1 TABLET BY MOUTH EVERY DAY AT BEDTIME AS NEEDED  Dispense: 30 tablet; Refill: 5 -     Alendronate Sodium; Take 1 tablet (70 mg total) by mouth every 7 (seven) days. Take with a full glass of water on an empty stomach.  Dispense: 4 tablet; Refill: 11     I provided 30 minutes of face-to-face time during this encounter reviewing patient's last visit with me, patient's  most recent visit with cardiology,  recent surgical and non surgical procedures, previous  labs and imaging studies, counseling on currently addressed issues,  and post visit ordering to diagnostics and therapeutics .   Follow-up: Return in about 6 months (around 04/08/2023).   Crecencio Mc, MD

## 2022-10-08 NOTE — Assessment & Plan Note (Signed)
Well controlled on current regimen.  Of amlodipine. , no changes today.

## 2022-10-08 NOTE — Assessment & Plan Note (Signed)
She has tried the natural remedies for insomnia including herbal tea and melatonin, and adheres to the principles of good sleep hygiene.  Trial of lunesta was also not helpful.  I have reviewed the dangers of prescribing ambien to the elderly with patient.  She prefers to continue using [place]  .  Refills given

## 2022-10-08 NOTE — Patient Instructions (Signed)
TRIAL OF ALENDRONATE  for osteoporosis prevention (you are close based on last DEXA scan)  Once weekly on an empty stomach,  with a full glass of water Remain upright and fasting for 40 minutes  1200 to 1800 mg calcium daily throught diet and supplements Chekcing vitamin d level today  Weight bearing exercise (yoga yoga yoga 1)

## 2022-10-08 NOTE — Assessment & Plan Note (Signed)
Repeat DEXA 2023 notes  Unchanged  Tscores of -2.3, but her risk of fracture is 24%.  Risks nad benfits of pharmacothrerapy with alendronate and raloxifene reviewe ddwith patient today. Recommending alendronate which she has agreed to  .  Reviewed calcium and vitamin D needs

## 2022-12-14 DIAGNOSIS — L309 Dermatitis, unspecified: Secondary | ICD-10-CM | POA: Diagnosis not present

## 2022-12-19 ENCOUNTER — Other Ambulatory Visit: Payer: Self-pay | Admitting: Internal Medicine

## 2022-12-19 NOTE — Telephone Encounter (Signed)
PA is needed for Zolpidem

## 2022-12-19 NOTE — Telephone Encounter (Signed)
Pt need a refill on ambien sent to [facility]

## 2022-12-26 ENCOUNTER — Other Ambulatory Visit (HOSPITAL_COMMUNITY): Payer: Self-pay

## 2022-12-26 MED ORDER — ZOLPIDEM TARTRATE 10 MG PO TABS: ORAL_TABLET | ORAL | 5 refills | Status: DC

## 2022-12-26 NOTE — Telephone Encounter (Signed)
PA has been submitted and documented in separate encounter. Please sign off as PA team is unable to resolve Rx Requests. Thanks

## 2023-03-20 ENCOUNTER — Encounter (INDEPENDENT_AMBULATORY_CARE_PROVIDER_SITE_OTHER): Payer: Self-pay

## 2023-04-11 ENCOUNTER — Encounter: Payer: Self-pay | Admitting: Internal Medicine

## 2023-04-11 ENCOUNTER — Ambulatory Visit (INDEPENDENT_AMBULATORY_CARE_PROVIDER_SITE_OTHER): Payer: Medicare HMO | Admitting: Internal Medicine

## 2023-04-11 VITALS — BP 130/78 | HR 68 | Temp 97.7°F | Ht 65.0 in | Wt 122.6 lb

## 2023-04-11 DIAGNOSIS — M8580 Other specified disorders of bone density and structure, unspecified site: Secondary | ICD-10-CM

## 2023-04-11 DIAGNOSIS — F430 Acute stress reaction: Secondary | ICD-10-CM | POA: Diagnosis not present

## 2023-04-11 DIAGNOSIS — E785 Hyperlipidemia, unspecified: Secondary | ICD-10-CM | POA: Diagnosis not present

## 2023-04-11 DIAGNOSIS — R634 Abnormal weight loss: Secondary | ICD-10-CM | POA: Diagnosis not present

## 2023-04-11 DIAGNOSIS — F411 Generalized anxiety disorder: Secondary | ICD-10-CM

## 2023-04-11 DIAGNOSIS — I10 Essential (primary) hypertension: Secondary | ICD-10-CM

## 2023-04-11 DIAGNOSIS — Z0001 Encounter for general adult medical examination with abnormal findings: Secondary | ICD-10-CM | POA: Diagnosis not present

## 2023-04-11 DIAGNOSIS — Z1231 Encounter for screening mammogram for malignant neoplasm of breast: Secondary | ICD-10-CM

## 2023-04-11 DIAGNOSIS — F5101 Primary insomnia: Secondary | ICD-10-CM | POA: Diagnosis not present

## 2023-04-11 DIAGNOSIS — Z780 Asymptomatic menopausal state: Secondary | ICD-10-CM

## 2023-04-11 LAB — COMPREHENSIVE METABOLIC PANEL
ALT: 12 U/L (ref 0–35)
AST: 21 U/L (ref 0–37)
Albumin: 4.2 g/dL (ref 3.5–5.2)
Alkaline Phosphatase: 48 U/L (ref 39–117)
BUN: 10 mg/dL (ref 6–23)
CO2: 29 meq/L (ref 19–32)
Calcium: 9.5 mg/dL (ref 8.4–10.5)
Chloride: 100 meq/L (ref 96–112)
Creatinine, Ser: 0.69 mg/dL (ref 0.40–1.20)
GFR: 86.25 mL/min (ref >60.00)
Glucose, Bld: 95 mg/dL (ref 70–99)
Potassium: 3.9 mEq/L (ref 3.5–5.1)
Sodium: 136 meq/L (ref 135–145)
Total Bilirubin: 0.7 mg/dL (ref 0.2–1.2)
Total Protein: 7.3 g/dL (ref 6.0–8.3)

## 2023-04-11 LAB — MICROALBUMIN / CREATININE URINE RATIO
Creatinine,U: 169.4 mg/dL
Microalb Creat Ratio: 0.7 mg/g (ref 0.0–30.0)
Microalb, Ur: 1.2 mg/dL (ref 0.0–1.9)

## 2023-04-11 LAB — LIPID PANEL
Cholesterol: 235 mg/dL — ABNORMAL HIGH (ref 0–200)
HDL: 87.8 mg/dL (ref >39.00)
LDL Cholesterol: 134 mg/dL — ABNORMAL HIGH (ref 0–99)
NonHDL: 146.85
Total CHOL/HDL Ratio: 3
Triglycerides: 65 mg/dL (ref 0.0–149.0)
VLDL: 13 mg/dL (ref 0.0–40.0)

## 2023-04-11 LAB — LDL CHOLESTEROL, DIRECT: Direct LDL: 134 mg/dL

## 2023-04-11 MED ORDER — ALPRAZOLAM 0.25 MG PO TABS: 0.2500 mg | ORAL_TABLET | Freq: Two times a day (BID) | ORAL | 2 refills | Status: DC | PRN

## 2023-04-11 MED ORDER — ALENDRONATE SODIUM 70 MG PO TABS: 70.0000 mg | ORAL_TABLET | ORAL | 11 refills | Status: DC

## 2023-04-11 NOTE — Patient Instructions (Addendum)
You need to  add a  daily a premixed protein drink  to supplement your diet.  The one I use is called Chartered certified accountant  . It is great tasting,   very low sugar and available of < $2 serving at [facility] and  In bulk for $1.50/serving at [employer] and [employer]  .    Nutritional analysis :  160 cal  30 g protein  1 g sugar 50% calcium needs   sold I 4 packs at all grocery stores, in bulk at [facility] and [employer]   I have prescribed alprazolam to help your stress .  If you find yourself needing to use it more than once  daily,  We should consider a safer daily alternative that is not habit forming.  (like celexa,  Lexapro,  buspar or zoloft)   Your annual mammogram has been ordered AND IS DUE in LATE September   [place] will not allow [place] to schedule it for you,  so please  call to make your appointment [phone]

## 2023-04-11 NOTE — Progress Notes (Signed)
Patient ID: Kimberly Gill, female    DOB: 01-13-1951  Age: 72 y.o. MRN: [id number]  The patient is here for annual preventive examination and management of other chronic and acute problems.   The risk factors are reflected in the social history.   The roster of all physicians providing medical care to patient - is listed in the Snapshot section of the chart.   Activities of daily living:  The patient is 100% independent in all ADLs: dressing, toileting, feeding as well as independent mobility   Home safety : The patient has smoke detectors in the home. They wear seatbelts.  There are no unsecured firearms at home. There is no violence in the home.    There is no risks for hepatitis, STDs or HIV. There is no   history of blood transfusion. They have no travel history to infectious disease endemic areas of the world.   The patient has seen their dentist in the last six month. They have seen their eye doctor in the last year. The patinet  denies slight hearing difficulty with regard to whispered voices and some television programs.  They have deferred audiologic testing in the last year.  They do not  have excessive sun exposure. Discussed the need for sun protection: hats, long sleeves and use of sunscreen if there is significant sun exposure.    Diet: the importance of a healthy diet is discussed. They do have a healthy diet.   The benefits of regular aerobic exercise were discussed. The patient  exercises  3 to 5 days per week  for  60 minutes.    Depression screen: there are no signs or vegative symptoms of depression- irritability, change in appetite, anhedonia, sadness/tearfullness.   The following portions of the patient's history were reviewed and updated as appropriate: allergies, current medications, past family history, past medical history,  past surgical history, past social history  and problem list.   Visual acuity was not assessed per patient preference since the patient has  regular follow up with an  ophthalmologist. Hearing and body mass index were assessed and reviewed.    During the course of the visit the patient was educated and counseled about appropriate screening and preventive services including : fall prevention , diabetes screening, nutrition counseling, colorectal cancer screening, and recommended immunizations.    Chief Complaint:   1) unintentional weight loss: secondary to stress.  Husband was undergoing treatment for multiple myeloma and had a CVA last Friday.  Admitted to ICU wih COE STROKE    Review of Symptoms  Patient denies headache, fevers, malaise, unintentional weight loss, skin rash, eye pain, sinus congestion and sinus pain, sore throat, dysphagia,  hemoptysis , cough, dyspnea, wheezing, chest pain, palpitations, orthopnea, edema, abdominal pain, nausea, melena, diarrhea, constipation, flank pain, dysuria, hematuria, urinary  Frequency, nocturia, numbness, tingling, seizures,  Focal weakness, Loss of consciousness,  Tremor, insomnia, depression,  and suicidal ideation.    Physical Exam:  BP 130/78   Pulse 68   Temp 97.7 F (36.5 C) (Oral)   Ht 5\' 5"  (1.651 m)   Wt 122 lb 9.6 oz (55.6 kg)   SpO2 98%   BMI 20.40 kg/m    Physical Exam Vitals reviewed.  Constitutional:      General: She is not in acute distress.    Appearance: Normal appearance. She is well-developed and underweight. She is not ill-appearing, toxic-appearing or diaphoretic.  HENT:     Head: Normocephalic.     Right Ear:  Tympanic membrane, ear canal and external ear normal. There is no impacted cerumen.     Left Ear: Tympanic membrane, ear canal and external ear normal. There is no impacted cerumen.     Nose: Nose normal.     Mouth/Throat:     Mouth: Mucous membranes are moist.     Pharynx: Oropharynx is clear.  Eyes:     General: No scleral icterus.       Right eye: No discharge.        Left eye: No discharge.     Conjunctiva/sclera: Conjunctivae normal.      Pupils: Pupils are equal, round, and reactive to light.  Neck:     Thyroid: No thyromegaly.     Vascular: No carotid bruit or JVD.  Cardiovascular:     Rate and Rhythm: Normal rate and regular rhythm.     Heart sounds: Normal heart sounds.  Pulmonary:     Effort: Pulmonary effort is normal. No respiratory distress.     Breath sounds: Normal breath sounds.  Chest:  Breasts:    Breasts are symmetrical.     Right: Normal. No swelling, inverted nipple, mass, nipple discharge, skin change or tenderness.     Left: Normal. No swelling, inverted nipple, mass, nipple discharge, skin change or tenderness.  Abdominal:     General: Bowel sounds are normal.     Palpations: Abdomen is soft. There is no mass.     Tenderness: There is no abdominal tenderness. There is no guarding or rebound.  Musculoskeletal:        General: Normal range of motion.     Cervical back: Normal range of motion and neck supple.  Lymphadenopathy:     Cervical: No cervical adenopathy.     Upper Body:     Right upper body: No supraclavicular, axillary or pectoral adenopathy.     Left upper body: No supraclavicular, axillary or pectoral adenopathy.  Skin:    General: Skin is warm and dry.  Neurological:     General: No focal deficit present.     Mental Status: She is alert and oriented to person, place, and time. Mental status is at baseline.  Psychiatric:        Mood and Affect: Mood normal.        Behavior: Behavior normal.        Thought Content: Thought content normal.        Judgment: Judgment normal.    Assessment and Plan: Essential hypertension Assessment & Plan: Well controlled on current regimen.  Of amlodipine. , no changes today.   Orders: -     Comprehensive metabolic panel -     Microalbumin / creatinine urine ratio  Hyperlipidemia, unspecified hyperlipidemia type -     Lipid panel -     LDL cholesterol, direct  Breast cancer screening by mammogram -     3D Screening Mammogram, Left and  Right; Future  Weight loss, unintentional Assessment & Plan: Attributed by patient to stress of cargiving , but will evaluate thyroid function   Lab Results  Component Value Date   TSH 4.71 10/08/2022     Orders: -     TSH  Encounter for general adult medical examination with abnormal findings Assessment & Plan: age appropriate education and counseling updated, referrals for preventative services and immunizations addressed, dietary and smoking counseling addressed, most recent labs reviewed.  I have personally reviewed and have noted:   1) the patient's medical and social history 2) The  pt's use of alcohol, tobacco, and illicit drugs 3) The patient's current medications and supplements 4) Functional ability including ADL's, fall risk, home safety risk, hearing and visual impairment 5) Diet and physical activities 6) Evidence for depression or mood disorder 7) The patient's height, weight, and BMI have been recorded in the chart 8) I have ordered and reviewed a 12 lead EKG and find that there are no acute changes and patient is in sinus rhythm.     I have made referrals, and provided counseling and education based on review of the above    Primary insomnia Assessment & Plan: She has tried the natural remedies for insomnia including herbal tea and melatonin, and adheres to the principles of good sleep hygiene.  Trial of lunesta was also not helpful.  I have reviewed the dangers of prescribing ambien to the elderly with patient.  She prefers to continue using [place]  .  Refills given . Advised not to combine with alprazolam   Anxiety as acute reaction to exceptional stress Assessment & Plan: Secondary to husband's life threatening illnesses.  She has requested a low dose of alprazolam to use prn.  The risks and benefits of benzodiazepine use were discussed with patient today including excessive sedation leading to respiratory depression,  impaired thinking/driving, and addiction.   Patient was advised to avoid concurrent use with alcohol, to use medication only as needed and not to share with others  .     Osteopenia after menopause Assessment & Plan: Repeat DEXA 2023 notes  Unchanged  Tscores of -2.3, but her risk of fracture is 24%.  Continue use of  alendronate  Reviewed calcium and vitamin D needs    Other orders -     ALPRAZolam; Take 1 tablet (0.25 mg total) by mouth 2 (two) times daily as needed for anxiety.  Dispense: 30 tablet; Refill: 2 -     Alendronate Sodium; Take 1 tablet (70 mg total) by mouth every 7 (seven) days. Take with a full glass of water on an empty stomach.  Dispense: 4 tablet; Refill: 11    No follow-ups on file.  Sherlene Shams, MD

## 2023-04-12 ENCOUNTER — Encounter: Payer: Self-pay | Admitting: Internal Medicine

## 2023-04-12 DIAGNOSIS — F411 Generalized anxiety disorder: Secondary | ICD-10-CM | POA: Insufficient documentation

## 2023-04-12 DIAGNOSIS — F430 Acute stress reaction: Secondary | ICD-10-CM

## 2023-04-12 DIAGNOSIS — R634 Abnormal weight loss: Secondary | ICD-10-CM | POA: Insufficient documentation

## 2023-04-12 HISTORY — DX: Generalized anxiety disorder: F41.1

## 2023-04-12 LAB — TSH: TSH: 2.47 u[IU]/mL (ref 0.35–5.50)

## 2023-04-12 NOTE — Assessment & Plan Note (Signed)
She has tried the natural remedies for insomnia including herbal tea and melatonin, and adheres to the principles of good sleep hygiene.  Trial of lunesta was also not helpful.  I have reviewed the dangers of prescribing ambien to the elderly with patient.  She prefers to continue using [place]  .  Refills given . Advised not to combine with alprazolam

## 2023-04-12 NOTE — Assessment & Plan Note (Signed)

## 2023-04-12 NOTE — Assessment & Plan Note (Signed)
Secondary to husband's life threatening illnesses.  She has requested a low dose of alprazolam to use prn.  The risks and benefits of benzodiazepine use were discussed with patient today including excessive sedation leading to respiratory depression,  impaired thinking/driving, and addiction.  Patient was advised to avoid concurrent use with alcohol, to use medication only as needed and not to share with others  .

## 2023-04-12 NOTE — Assessment & Plan Note (Signed)
Attributed by patient to stress of cargiving , but will evaluate thyroid function   Lab Results  Component Value Date   TSH 4.71 10/08/2022

## 2023-04-12 NOTE — Assessment & Plan Note (Signed)
Repeat DEXA 2023 notes  Unchanged  Tscores of -2.3, but her risk of fracture is 24%.  Continue use of  alendronate  Reviewed calcium and vitamin D needs

## 2023-04-12 NOTE — Assessment & Plan Note (Signed)
Well controlled on current regimen.  Of amlodipine. , no changes today.

## 2023-04-15 ENCOUNTER — Ambulatory Visit (INDEPENDENT_AMBULATORY_CARE_PROVIDER_SITE_OTHER): Payer: Medicare HMO | Admitting: Family

## 2023-04-15 ENCOUNTER — Encounter: Payer: Self-pay | Admitting: Family

## 2023-04-15 VITALS — BP 126/76 | HR 78 | Temp 97.8°F | Ht 65.0 in | Wt 123.2 lb

## 2023-04-15 DIAGNOSIS — R300 Dysuria: Secondary | ICD-10-CM

## 2023-04-15 DIAGNOSIS — R399 Unspecified symptoms and signs involving the genitourinary system: Secondary | ICD-10-CM | POA: Diagnosis not present

## 2023-04-15 LAB — URINALYSIS, MICROSCOPIC ONLY: RBC / HPF: NONE SEEN (ref 0–?)

## 2023-04-15 LAB — POCT URINALYSIS DIPSTICK
Bilirubin, UA: NEGATIVE
Blood, UA: NEGATIVE
Glucose, UA: NEGATIVE
Leukocytes, UA: NEGATIVE
Nitrite, UA: POSITIVE
Protein, UA: NEGATIVE
Spec Grav, UA: 1.01 (ref 1.010–1.025)
Urobilinogen, UA: 0.2 E.U./dL
pH, UA: 5 (ref 5.0–8.0)

## 2023-04-15 NOTE — Assessment & Plan Note (Signed)
Afebrile.  Point-of-care urine shows positive nitrites, negative leukocytes negative blood.  Patient symptoms slightly improved today.  Patient and I  discussed waiting on culture as long as symptoms continue to improve.  She will let me know if they were to acutely worsen

## 2023-04-15 NOTE — Patient Instructions (Signed)
You may start over-the-counter Azo with cranberry for prevention.  We also use d-mannose for urinary tract prevention.  Please continue drinking plenty of water.  Please let me know if symptoms were to acutely worsen while we await on urine culture

## 2023-04-15 NOTE — Progress Notes (Signed)
Assessment & Plan:  UTI symptoms -     POCT urinalysis dipstick -     Urine Culture -     Urine Microscopic  Dysuria Assessment & Plan: Afebrile.  Point-of-care urine shows positive nitrites, negative leukocytes negative blood.  Patient symptoms slightly improved today.  Patient and I  discussed waiting on culture as long as symptoms continue to improve.  She will let me know if they were to acutely worsen      Return precautions given.   Risks, benefits, and alternatives of the medications and treatment plan prescribed today were discussed, and patient expressed understanding.   Education regarding symptom management and diagnosis given to patient on AVS either electronically or printed.  No follow-ups on file.  Kimberly Plowman, FNP  Subjective:    Patient ID: Kimberly Gill, female    DOB: Jun 15, 1951, 73 y.o.   MRN: [id number]  CC: Kimberly Gill is a 72 y.o. female who presents today for an acute visit.    HPI: Complains of dysuria, urinary frequency x 3 days, with some improvement today.  Denies fever, nausea, flank pain.  She does endorse diffuse bilateral low back ache.  No recent urinary tract infection.  She has been drinking a lot of water   She never started estradiol due to cost  Allergies: Sulfa antibiotics, Penicillins, and Sulfa drugs cross reactors Current Outpatient Medications on File Prior to Visit  Medication Sig Dispense Refill   alendronate (FOSAMAX) 70 MG tablet Take 1 tablet (70 mg total) by mouth every 7 (seven) days. Take with a full glass of water on an empty stomach. 4 tablet 11   ALPRAZolam (XANAX) 0.25 MG tablet Take 1 tablet (0.25 mg total) by mouth 2 (two) times daily as needed for anxiety. 30 tablet 2   amLODipine (NORVASC) 2.5 MG tablet Take 1 tablet (2.5 mg total) by mouth daily. 90 tablet 3   calcium citrate-vitamin D (CITRACAL+D) 315-200 MG-UNIT per tablet Take 1 tablet by mouth 2 (two) times daily.     Multiple Vitamin  (MULTIVITAMIN) tablet Take 1 tablet by mouth daily.     zolpidem (AMBIEN) 10 MG tablet TAKE 1 TABLET BY MOUTH EVERY DAY AT BEDTIME AS NEEDED 30 tablet 5   No current facility-administered medications on file prior to visit.    Review of Systems  Constitutional:  Negative for chills and fever.  Respiratory:  Negative for cough.   Cardiovascular:  Negative for chest pain and palpitations.  Gastrointestinal:  Negative for nausea and vomiting.  Genitourinary:  Positive for dysuria. Negative for hematuria.  Musculoskeletal:  Positive for back pain.      Objective:    BP 126/76   Pulse 78   Temp 97.8 F (36.6 C) (Oral)   Ht 5\' 5"  (1.651 m)   Wt 123 lb 3.2 oz (55.9 kg)   SpO2 99%   BMI 20.50 kg/m   BP Readings from Last 3 Encounters:  04/15/23 126/76  04/11/23 130/78  10/08/22 120/70   Wt Readings from Last 3 Encounters:  04/15/23 123 lb 3.2 oz (55.9 kg)  04/11/23 122 lb 9.6 oz (55.6 kg)  10/08/22 139 lb (63 kg)    Physical Exam Vitals reviewed.  Constitutional:      Appearance: She is well-developed.  Cardiovascular:     Rate and Rhythm: Normal rate and regular rhythm.     Pulses: Normal pulses.     Heart sounds: Normal heart sounds.  Pulmonary:     Effort:  Pulmonary effort is normal.     Breath sounds: Normal breath sounds. No wheezing, rhonchi or rales.  Skin:    General: Skin is warm and dry.  Neurological:     Mental Status: She is alert.  Psychiatric:        Speech: Speech normal.        Behavior: Behavior normal.        Thought Content: Thought content normal.

## 2023-04-16 LAB — URINE CULTURE
MICRO NUMBER:: 15381272
Result:: NO GROWTH
SPECIMEN QUALITY:: ADEQUATE

## 2023-04-23 ENCOUNTER — Ambulatory Visit (INDEPENDENT_AMBULATORY_CARE_PROVIDER_SITE_OTHER): Payer: Medicare HMO | Admitting: *Deleted

## 2023-04-23 VITALS — Ht 65.0 in | Wt 124.0 lb

## 2023-04-23 DIAGNOSIS — Z0000 Encounter for general adult medical examination without abnormal findings: Secondary | ICD-10-CM

## 2023-04-23 NOTE — Progress Notes (Addendum)
Subjective:   Kimberly Gill is a 72 y.o. female who presents for Medicare Annual (Subsequent) preventive examination.  Visit Complete: Virtual  I connected with  Kimberly Gill on 04/23/23 by a audio enabled telemedicine application and verified that I am speaking with the correct person using two identifiers.  Patient Location: Home  Provider Location: Office/Clinic  I discussed the limitations of evaluation and management by telemedicine. The patient expressed understanding and agreed to proceed.  Vital Signs: Unable to obtain new vitals due to this being a telehealth visit.  Review of Systems     Cardiac Risk Factors include: advanced age (>33men, >12 women);hypertension     Objective:    Today's Vitals   04/23/23 1020  Weight: 124 lb (56.2 kg)  Height: 5\' 5"  (1.651 m)   Body mass index is 20.63 kg/m.     04/23/2023   10:29 AM 03/27/2022    2:17 PM 04/24/2017    9:54 AM  Advanced Directives  Does Patient Have a Medical Advance Directive? Yes Yes Yes  Type of Estate agent of [place];Living will Healthcare Power of [place];Living will Healthcare Power of [place];Living will  Does patient want to make changes to medical advance directive?  No - Patient declined No - Patient declined  Copy of Healthcare Power of Attorney in Chart? No - copy requested No - copy requested No - copy requested    Current Medications (verified) Outpatient Encounter Medications as of 04/23/2023  Medication Sig   alendronate (FOSAMAX) 70 MG tablet Take 1 tablet (70 mg total) by mouth every 7 (seven) days. Take with a full glass of water on an empty stomach.   ALPRAZolam (XANAX) 0.25 MG tablet Take 1 tablet (0.25 mg total) by mouth 2 (two) times daily as needed for anxiety.   amLODipine (NORVASC) 2.5 MG tablet Take 1 tablet (2.5 mg total) by mouth daily.   calcium citrate-vitamin D (CITRACAL+D) 315-200 MG-UNIT per tablet Take 1 tablet by mouth 2 (two) times daily.    Multiple Vitamin (MULTIVITAMIN) tablet Take 1 tablet by mouth daily.   zolpidem (AMBIEN) 10 MG tablet TAKE 1 TABLET BY MOUTH EVERY DAY AT BEDTIME AS NEEDED   No facility-administered encounter medications on file as of 04/23/2023.    Allergies (verified) Sulfa antibiotics, Penicillins, and Sulfa drugs cross reactors   History: Past Medical History:  Diagnosis Date   Acute COVID-19 05/02/2021   Anxiety as acute reaction to exceptional stress 04/12/2023   Past Surgical History:  Procedure Laterality Date   APPENDECTOMY  1972   BREAST BIOPSY Left 09/09/2009   neg   BUNIONECTOMY     COSMETIC SURGERY  2001   TUBAL LIGATION  1989   Family History  Problem Relation Age of Onset   Hypertension Mother    Osteoporosis Mother    Congestive Heart Failure Mother    Hypertension Father    Mental retardation Father        dementia   Congestive Heart Failure Father    Alcohol abuse Sister    Mental illness Sister    Hypertension Sister    Breast cancer Neg Hx    Social History   Socioeconomic History   Marital status: Married    Spouse name: Not on file   Number of children: Not on file   Years of education: Not on file   Highest education level: Not on file  Occupational History   Not on file  Tobacco Use   Smoking status:  Never   Smokeless tobacco: Never  Vaping Use   Vaping status: Not on file  Substance and Sexual Activity   Alcohol use: Yes    Alcohol/week: 2.0 standard drinks of alcohol    Types: 2 Glasses of wine per week   Drug use: No   Sexual activity: Not Currently  Other Topics Concern   Not on file  Social History Narrative   Retired, married   Social Determinants of Health   Financial Resource Strain: Low Risk  (04/23/2023)   Overall Financial Resource Strain (CARDIA)    Difficulty of Paying Living Expenses: Not hard at all  Food Insecurity: No Food Insecurity (04/23/2023)   Hunger Vital Sign    Worried About Running Out of Food in the Last Year: Never  true    Ran Out of Food in the Last Year: Never true  Transportation Needs: No Transportation Needs (04/23/2023)   PRAPARE - Administrator, Civil Service (Medical): No    Lack of Transportation (Non-Medical): No  Physical Activity: Sufficiently Active (04/23/2023)   Exercise Vital Sign    Days of Exercise per Week: 3 days    Minutes of Exercise per Session: 60 min  Stress: No Stress Concern Present (04/23/2023)   [employer] of Occupational Health - Occupational Stress Questionnaire    Feeling of Stress : Only a little  Social Connections: Socially Integrated (04/23/2023)   Social Connection and Isolation Panel [NHANES]    Frequency of Communication with Friends and Family: More than three times a week    Frequency of Social Gatherings with Friends and Family: Twice a week    Attends Religious Services: More than 4 times per year    Active Member of [employer] or Organizations: Yes    Attends Engineer, structural: More than 4 times per year    Marital Status: Married    Tobacco Counseling Counseling given: Not Answered   Clinical Intake:  Pre-visit preparation completed: Yes  Pain : No/denies pain     BMI - recorded: 20.63 Nutritional Status: BMI of 19-24  Normal Nutritional Risks: None Diabetes: No  How often do you need to have someone help you when you read instructions, pamphlets, or other written materials from your doctor or pharmacy?: 1 - Never  Interpreter Needed?: No  Information entered by :: R. Malcomb Gangemi LPN   Activities of Daily Living    04/23/2023   10:22 AM  In your present state of health, do you have any difficulty performing the following activities:  Hearing? 0  Vision? 0  Comment readers  Difficulty concentrating or making decisions? 0  Walking or climbing stairs? 0  Dressing or bathing? 0  Doing errands, shopping? 0  Preparing Food and eating ? N  Using the Toilet? N  In the past six months, have you accidently leaked urine? N   Do you have problems with loss of bowel control? N  Managing your Medications? N  Managing your Finances? N  Housekeeping or managing your Housekeeping? N    Patient Care Team: Sherlene Shams, MD as PCP - General (Internal Medicine)  Indicate any recent Medical Services you may have received from other than [facility] providers in the past year (date may be approximate).     Assessment:   This is a routine wellness examination for Kimberly Gill.  Hearing/Vision screen Hearing Screening - Comments:: No issues Vision Screening - Comments:: readers  Dietary issues and exercise activities discussed:     Goals Addressed  This Visit's Progress    Patient Stated       Wants to walk more and spend more time outdoors       Depression Screen    04/23/2023   10:25 AM 04/15/2023   12:38 PM 04/11/2023    9:01 AM 10/08/2022    7:56 AM 04/05/2022    8:15 AM 04/05/2022    8:03 AM 03/27/2022    2:14 PM  PHQ 2/9 Scores  PHQ - 2 Score 0 0 1 0 0 0 0  PHQ- 9 Score 0   0       Fall Risk    04/23/2023   10:23 AM 04/15/2023   12:38 PM 04/11/2023    9:01 AM 10/08/2022    7:56 AM 04/05/2022    8:14 AM  Fall Risk   Falls in the past year? 0 0 0 0 0  Number falls in past yr: 0 0 0 0   Injury with Fall? 0 0 0 0   Risk for fall due to : No Fall Risks No Fall Risks No Fall Risks No Fall Risks No Fall Risks  Follow up Falls prevention discussed;Falls evaluation completed Falls evaluation completed Falls evaluation completed Falls evaluation completed Falls evaluation completed    MEDICARE RISK AT HOME: Medicare Risk at Home Any stairs in or around the home?: Yes If so, are there any without handrails?: No Home free of loose throw rugs in walkways, pet beds, electrical cords, etc?: Yes Adequate lighting in your home to reduce risk of falls?: Yes Life alert?: No Use of a cane, walker or w/c?: No Grab bars in the bathroom?: Yes Shower chair or bench in shower?: Yes Elevated toilet seat or a  handicapped toilet?: Yes      Cognitive Function:    04/24/2017    9:59 AM  MMSE - Mini Mental State Exam  Orientation to time 5  Orientation to Place 5  Registration 3  Attention/ Calculation 5  Recall 3  Language- name 2 objects 2  Language- repeat 1  Language- follow 3 step command 3  Language- read & follow direction 1  Write a sentence 1  Copy design 1  Total score 30        04/23/2023   10:29 AM  6CIT Screen  What Year? 0 points  What month? 0 points  What time? 0 points  Count back from 20 0 points  Months in reverse 0 points  Repeat phrase 0 points  Total Score 0 points    Immunizations Immunization History  Administered Date(s) Administered   Fluad Quad(high Dose 65+) 05/22/2019   Influenza, High Dose Seasonal PF 04/19/2016, 05/30/2018   Influenza,inj,Quad PF,6+ Mos 04/16/2014, 04/20/2015, 04/24/2017   Influenza-Unspecified 06/20/2020, 06/20/2021   PFIZER(Purple Top)SARS-COV-2 Vaccination 09/25/2019, 10/20/2019, 05/20/2020   PNEUMOCOCCAL CONJUGATE-20 10/05/2021   Pfizer Covid-19 Vaccine Bivalent Booster 11yrs & up 07/21/2021   Pneumococcal Conjugate-13 04/20/2015   Pneumococcal Polysaccharide-23 11/13/2016   Tdap 03/14/2012   Zoster, Live 02/13/2012    TDAP status: Due, Education has been provided regarding the importance of this vaccine. Advised may receive this vaccine at local pharmacy or Health Dept. Aware to provide a copy of the vaccination record if obtained from local pharmacy or Health Dept. Verbalized acceptance and understanding.  Flu Vaccine status: Due, Education has been provided regarding the importance of this vaccine. Advised may receive this vaccine at local pharmacy or Health Dept. Aware to provide a copy of the vaccination record if obtained from  local pharmacy or Health Dept. Verbalized acceptance and understanding.  Pneumococcal vaccine status: Completed during today's visit.  Covid-19 vaccine status: Completed vaccines  Qualifies  for Shingles Vaccine? Yes   Zostavax completed  Yes Shingles vaccine No  Screening Tests Health Maintenance  Topic Date Due   Zoster Vaccines- Shingrix (1 of 2) 02/07/2000   Medicare Annual Wellness (AWV)  03/28/2023   COVID-19 Vaccine (5 - 2023-24 season) 04/21/2023   INFLUENZA VACCINE  11/18/2023 (Originally 03/21/2023)   MAMMOGRAM  05/02/2023   Colonoscopy  07/24/2026   Pneumonia Vaccine 12+ Years old  Completed   DEXA SCAN  Completed   Hepatitis C Screening  Completed   HPV VACCINES  Aged Out   DTaP/Tdap/Td  Discontinued    Health Maintenance  Health Maintenance Due  Topic Date Due   Zoster Vaccines- Shingrix (1 of 2) 02/07/2000   Medicare Annual Wellness (AWV)  03/28/2023   COVID-19 Vaccine (5 - 2023-24 season) 04/21/2023    Colorectal cancer screening: Type of screening: Colonoscopy. Completed 12/17. Repeat every 10 years  Mammogram status: Completed 9/23. Repeat every year Order placed 04/11/23  Bone Density status: Completed 9/23. Results reflect: Bone density results: OSTEOPOROSIS. Repeat every 2 years.  Lung Cancer Screening: (Low Dose CT Chest recommended if Age 33-80 years, 20 pack-year currently smoking OR have quit w/in 15years.) does not qualify.    Additional Screening:  Hepatitis C Screening: does qualify; Completed 8/16  Vision Screening: Recommended annual ophthalmology exams for early detection of glaucoma and other disorders of the eye. Is the patient up to date with their annual eye exam?  Yes  Who is the provider or what is the name of the office in which the patient attends annual eye exams? [facility] If pt is not established with a provider, would they like to be referred to a provider to establish care? No .   Dental Screening: Recommended annual dental exams for proper oral hygiene    Community Resource Referral / Chronic Care Management: CRR required this visit?  No   CCM required this visit?  No     Plan:     I have personally  reviewed and noted the following in the patient's chart:   Medical and social history Use of alcohol, tobacco or illicit drugs  Current medications and supplements including opioid prescriptions. Patient is not currently taking opioid prescriptions. Functional ability and status Nutritional status Physical activity Advanced directives List of other physicians Hospitalizations, surgeries, and ER visits in previous 12 months Vitals Screenings to include cognitive, depression, and falls Referrals and appointments  In addition, I have reviewed and discussed with patient certain preventive protocols, quality metrics, and best practice recommendations. A written personalized care plan for preventive services as well as general preventive health recommendations were provided to patient.     Sydell Axon, LPN   08/25/1094   After Visit Summary: (MyChart) Due to this being a telephonic visit, the after visit summary with patients personalized plan was offered to patient via MyChart   Nurse Notes: None   I have reviewed the above information and agree with above.   Duncan Dull, MD

## 2023-04-23 NOTE — Patient Instructions (Signed)
Ms. Kimberly Gill , Thank you for taking time to come for your Medicare Wellness Visit. I appreciate your ongoing commitment to your health goals. Please review the following plan we discussed and let me know if I can assist you in the future.   Referrals/Orders/Follow-Ups/Clinician Recommendations: None  This is a list of the screening recommended for you and due dates:  Health Maintenance  Topic Date Due   Zoster (Shingles) Vaccine (1 of 2) 02/07/2000   COVID-19 Vaccine (5 - 2023-24 season) 04/21/2023   Flu Shot  11/18/2023*   Mammogram  05/02/2023   Medicare Annual Wellness Visit  04/22/2024   Colon Cancer Screening  07/24/2026   Pneumonia Vaccine  Completed   DEXA scan (bone density measurement)  Completed   Hepatitis C Screening  Completed   HPV Vaccine  Aged Out   DTaP/Tdap/Td vaccine  Discontinued  *Topic was postponed. The date shown is not the original due date.    Advanced directives: (Copy Requested) Please bring a copy of your health care power of attorney and living will to the office to be added to your chart at your convenience.  Next Medicare Annual Wellness Visit scheduled for next year: Yes 04/28/24 @ 1:30

## 2023-06-26 DIAGNOSIS — D2261 Melanocytic nevi of right upper limb, including shoulder: Secondary | ICD-10-CM | POA: Diagnosis not present

## 2023-06-26 DIAGNOSIS — L821 Other seborrheic keratosis: Secondary | ICD-10-CM | POA: Diagnosis not present

## 2023-06-26 DIAGNOSIS — L659 Nonscarring hair loss, unspecified: Secondary | ICD-10-CM | POA: Diagnosis not present

## 2023-06-26 DIAGNOSIS — D2271 Melanocytic nevi of right lower limb, including hip: Secondary | ICD-10-CM | POA: Diagnosis not present

## 2023-06-26 DIAGNOSIS — D2262 Melanocytic nevi of left upper limb, including shoulder: Secondary | ICD-10-CM | POA: Diagnosis not present

## 2023-06-26 DIAGNOSIS — D2272 Melanocytic nevi of left lower limb, including hip: Secondary | ICD-10-CM | POA: Diagnosis not present

## 2023-06-26 DIAGNOSIS — D225 Melanocytic nevi of trunk: Secondary | ICD-10-CM | POA: Diagnosis not present

## 2023-06-27 ENCOUNTER — Telehealth: Payer: Self-pay | Admitting: Internal Medicine

## 2023-06-27 MED ORDER — ZOLPIDEM TARTRATE 10 MG PO TABS
ORAL_TABLET | ORAL | 5 refills | Status: DC
Start: 2023-06-27 — End: 2023-10-14

## 2023-06-27 NOTE — Telephone Encounter (Signed)
Prescription Request  06/27/2023  LOV: 04/11/2023  What is the name of the medication or equipment? zolpidem   Have you contacted your pharmacy to request a refill? Yes   Which pharmacy would you like this sent to?  [id number] [place], [place] - [address] [place] [address] Phone: [phone] Fax: [phone]    Patient notified that their request is being sent to the clinical staff for review and that they should receive a response within 2 business days.   Please advise at Mobile [phone] (mobile)

## 2023-06-27 NOTE — Telephone Encounter (Signed)
Requesting: Zolpidem Contract: No UDS: No Last Visit: 04/11/2023 Next Visit: 10/14/2023 Last Refill: 12/26/2022  Please Advise

## 2023-06-27 NOTE — Addendum Note (Signed)
Addended by: Sherlene Shams on: 06/27/2023 01:44 PM   Modules accepted: Orders

## 2023-09-24 ENCOUNTER — Ambulatory Visit
Admission: RE | Admit: 2023-09-24 | Discharge: 2023-09-24 | Disposition: A | Discharge status: Home / Self Care | Payer: Medicare Other | Source: Ambulatory Visit | Attending: Internal Medicine | Admitting: Internal Medicine

## 2023-09-24 DIAGNOSIS — Z1231 Encounter for screening mammogram for malignant neoplasm of breast: Secondary | ICD-10-CM | POA: Insufficient documentation

## 2023-09-30 DIAGNOSIS — H524 Presbyopia: Secondary | ICD-10-CM | POA: Diagnosis not present

## 2023-10-14 ENCOUNTER — Encounter: Payer: Self-pay | Admitting: Internal Medicine

## 2023-10-14 ENCOUNTER — Ambulatory Visit (INDEPENDENT_AMBULATORY_CARE_PROVIDER_SITE_OTHER): Payer: Medicare Other | Admitting: Internal Medicine

## 2023-10-14 VITALS — BP 126/70 | HR 81 | Temp 98.0°F | Resp 16 | Ht 65.0 in | Wt 125.2 lb

## 2023-10-14 DIAGNOSIS — R5383 Other fatigue: Secondary | ICD-10-CM | POA: Diagnosis not present

## 2023-10-14 DIAGNOSIS — M8580 Other specified disorders of bone density and structure, unspecified site: Secondary | ICD-10-CM

## 2023-10-14 DIAGNOSIS — I10 Essential (primary) hypertension: Secondary | ICD-10-CM | POA: Diagnosis not present

## 2023-10-14 DIAGNOSIS — F430 Acute stress reaction: Secondary | ICD-10-CM

## 2023-10-14 DIAGNOSIS — E559 Vitamin D deficiency, unspecified: Secondary | ICD-10-CM

## 2023-10-14 DIAGNOSIS — R634 Abnormal weight loss: Secondary | ICD-10-CM | POA: Diagnosis not present

## 2023-10-14 DIAGNOSIS — R7301 Impaired fasting glucose: Secondary | ICD-10-CM

## 2023-10-14 DIAGNOSIS — Z780 Asymptomatic menopausal state: Secondary | ICD-10-CM

## 2023-10-14 DIAGNOSIS — E785 Hyperlipidemia, unspecified: Secondary | ICD-10-CM | POA: Diagnosis not present

## 2023-10-14 DIAGNOSIS — F411 Generalized anxiety disorder: Secondary | ICD-10-CM

## 2023-10-14 DIAGNOSIS — F5102 Adjustment insomnia: Secondary | ICD-10-CM

## 2023-10-14 LAB — CBC WITH DIFFERENTIAL/PLATELET
Basophils Absolute: 0.1 10*3/uL (ref 0.0–0.1)
Basophils Relative: 1.4 % (ref 0.0–3.0)
Eosinophils Absolute: 0.1 10*3/uL (ref 0.0–0.7)
Eosinophils Relative: 1.4 % (ref 0.0–5.0)
HCT: 40.1 % (ref 36.0–46.0)
Hemoglobin: 13.1 g/dL (ref 12.0–15.0)
Lymphocytes Relative: 32.1 % (ref 12.0–46.0)
Lymphs Abs: 1.5 10*3/uL (ref 0.7–4.0)
MCHC: 32.8 g/dL (ref 30.0–36.0)
MCV: 95.6 fl (ref 78.0–100.0)
Monocytes Absolute: 0.4 10*3/uL (ref 0.1–1.0)
Monocytes Relative: 9.7 % (ref 3.0–12.0)
Neutro Abs: 2.6 10*3/uL (ref 1.4–7.7)
Neutrophils Relative %: 55.4 % (ref 43.0–77.0)
Platelets: 257 10*3/uL (ref 150.0–400.0)
RBC: 4.19 Mil/uL (ref 3.87–5.11)
RDW: 14.1 % (ref 11.5–15.5)
WBC: 4.6 10*3/uL (ref 4.0–10.5)

## 2023-10-14 LAB — LIPID PANEL
Cholesterol: 238 mg/dL — ABNORMAL HIGH (ref 0–200)
HDL: 110.4 mg/dL (ref >39.00)
LDL Cholesterol: 117 mg/dL — ABNORMAL HIGH (ref 0–99)
NonHDL: 127.24
Total CHOL/HDL Ratio: 2
Triglycerides: 51 mg/dL (ref 0.0–149.0)
VLDL: 10.2 mg/dL (ref 0.0–40.0)

## 2023-10-14 LAB — HEMOGLOBIN A1C: Hgb A1c MFr Bld: 5.8 % (ref 4.6–6.5)

## 2023-10-14 LAB — TSH: TSH: 4.16 u[IU]/mL (ref 0.35–5.50)

## 2023-10-14 LAB — COMPREHENSIVE METABOLIC PANEL
ALT: 11 U/L (ref 0–35)
AST: 20 U/L (ref 0–37)
Albumin: 4.2 g/dL (ref 3.5–5.2)
Alkaline Phosphatase: 45 U/L (ref 39–117)
BUN: 10 mg/dL (ref 6–23)
CO2: 26 meq/L (ref 19–32)
Calcium: 9.1 mg/dL (ref 8.4–10.5)
Chloride: 103 meq/L (ref 96–112)
Creatinine, Ser: 0.75 mg/dL (ref 0.40–1.20)
GFR: 78.84 mL/min (ref >60.00)
Glucose, Bld: 100 mg/dL — ABNORMAL HIGH (ref 70–99)
Potassium: 4 meq/L (ref 3.5–5.1)
Sodium: 138 meq/L (ref 135–145)
Total Bilirubin: 0.7 mg/dL (ref 0.2–1.2)
Total Protein: 7.3 g/dL (ref 6.0–8.3)

## 2023-10-14 LAB — VITAMIN D 25 HYDROXY (VIT D DEFICIENCY, FRACTURES): VITD: 28.19 ng/mL — ABNORMAL LOW (ref 30.00–100.00)

## 2023-10-14 MED ORDER — ALPRAZOLAM 0.25 MG PO TABS: 0.1250 mg | ORAL_TABLET | Freq: Two times a day (BID) | ORAL | 5 refills | Status: AC | PRN

## 2023-10-14 MED ORDER — AMLODIPINE BESYLATE 2.5 MG PO TABS: 2.5000 mg | ORAL_TABLET | Freq: Every day | ORAL | 3 refills | Status: AC

## 2023-10-14 MED ORDER — ZOLPIDEM TARTRATE 10 MG PO TABS
ORAL_TABLET | ORAL | 5 refills | Status: DC
Start: 2023-10-14 — End: 2024-04-14

## 2023-10-14 NOTE — Assessment & Plan Note (Signed)
 She has tried the natural remedies for insomnia including herbal tea and melatonin, and adheres to the principles of good sleep hygiene.  Trial of lunesta was also not helpful.  I have reviewed the dangers of prescribing ambien to the elderly with patient.  She prefers to continue using [place]  .  Refills given . Advised not to combine with alprazolam

## 2023-10-14 NOTE — Progress Notes (Unsigned)
 Subjective:  Patient ID: Kimberly Gill, female    DOB: August 17, 1951  Age: 73 y.o. MRN: [id number]  CC: The primary encounter diagnosis was Weight loss, unintentional. Diagnoses of Vitamin D deficiency, Impaired fasting glucose, Hyperlipidemia, unspecified hyperlipidemia type, Essential hypertension, Fatigue, unspecified type, Adjustment insomnia, Osteopenia after menopause, Caregiver with fatigue, and Anxiety as acute reaction to exceptional stress were also pertinent to this visit.   HPI Kimberly Gill presents for  Chief Complaint  Patient presents with   Medical Management of Chronic Issues   1) anxiety/insomnia:  husband of 50 years, Kimberly Gill has been battling multiple myeloma which has not responded to multiple trials of chemotherapy.  She has been trying to prepare herself for his inevitable death .  She has abundant emotional support;  their son is living with them and she receives visits and meals from  many friends.  Sleeping with use of alprazolam vs ambien, never both. Uses alprazolam occasinally during the day when her anxiety overwhelms her.  Refill history confirmed via [place] Controlled Substance databas, accessed by me today.Marland Kitchen   2) Osteoporosis:  she s tolerating alendronate  3) Underweight:  she forgets to eat occasionally due to her attention to [place] and his needs.  She has lost 15 lbs sine Feb 2024  4) Hypertension: patient checks blood pressure once a month  at home.  Readings have been for the most part <130/80 at rest . Patient is following a reduced salt diet most days and is taking medications as prescribed   Outpatient Medications Prior to Visit  Medication Sig Dispense Refill   alendronate (FOSAMAX) 70 MG tablet Take 1 tablet (70 mg total) by mouth every 7 (seven) days. Take with a full glass of water on an empty stomach. 4 tablet 11   calcium citrate-vitamin D (CITRACAL+D) 315-200 MG-UNIT per tablet Take 1 tablet by mouth 2 (two) times daily.     Multiple  Vitamin (MULTIVITAMIN) tablet Take 1 tablet by mouth daily.     ALPRAZolam (XANAX) 0.25 MG tablet Take 1 tablet (0.25 mg total) by mouth 2 (two) times daily as needed for anxiety. 30 tablet 2   amLODipine (NORVASC) 2.5 MG tablet Take 1 tablet (2.5 mg total) by mouth daily. 90 tablet 3   zolpidem (AMBIEN) 10 MG tablet TAKE 1 TABLET BY MOUTH EVERY DAY AT BEDTIME AS NEEDED 30 tablet 5   No facility-administered medications prior to visit.    Review of Systems;  Patient denies headache, fevers, malaise, unintentional weight loss, skin rash, eye pain, sinus congestion and sinus pain, sore throat, dysphagia,  hemoptysis , cough, dyspnea, wheezing, chest pain, palpitations, orthopnea, edema, abdominal pain, nausea, melena, diarrhea, constipation, flank pain, dysuria, hematuria, urinary  Frequency, nocturia, numbness, tingling, seizures,  Focal weakness, Loss of consciousness,  Tremor, insomnia, depression, anxiety, and suicidal ideation.      Objective:  BP 126/70   Pulse 81   Temp 98 F (36.7 C)   Resp 16   Ht 5\' 5"  (1.651 m)   Wt 125 lb 3.2 oz (56.8 kg)   SpO2 98%   BMI 20.83 kg/m   BP Readings from Last 3 Encounters:  10/14/23 126/70  04/15/23 126/76  04/11/23 130/78    Wt Readings from Last 3 Encounters:  10/14/23 125 lb 3.2 oz (56.8 kg)  04/23/23 124 lb (56.2 kg)  04/15/23 123 lb 3.2 oz (55.9 kg)    Physical Exam Vitals reviewed.  Constitutional:      General: She is not  in acute distress.    Appearance: Normal appearance. She is underweight. She is not ill-appearing, toxic-appearing or diaphoretic.  HENT:     Head: Normocephalic.  Eyes:     General: No scleral icterus.       Right eye: No discharge.        Left eye: No discharge.     Conjunctiva/sclera: Conjunctivae normal.  Cardiovascular:     Rate and Rhythm: Normal rate and regular rhythm.     Heart sounds: Normal heart sounds.  Pulmonary:     Effort: Pulmonary effort is normal. No respiratory distress.      Breath sounds: Normal breath sounds.  Musculoskeletal:        General: Normal range of motion.  Skin:    General: Skin is warm and dry.  Neurological:     General: No focal deficit present.     Mental Status: She is alert and oriented to person, place, and time. Mental status is at baseline.  Psychiatric:        Mood and Affect: Mood normal.        Behavior: Behavior normal.        Thought Content: Thought content normal.        Judgment: Judgment normal.     Lab Results  Component Value Date   HGBA1C 5.8 10/14/2023   HGBA1C 5.7 10/08/2022   HGBA1C 6.0 04/05/2022    Lab Results  Component Value Date   CREATININE 0.75 10/14/2023   CREATININE 0.69 04/11/2023   CREATININE 0.83 10/08/2022    Lab Results  Component Value Date   WBC 4.6 10/14/2023   HGB 13.1 10/14/2023   HCT 40.1 10/14/2023   PLT 257.0 10/14/2023   GLUCOSE 100 (H) 10/14/2023   CHOL 238 (H) 10/14/2023   TRIG 51.0 10/14/2023   HDL 110.40 10/14/2023   LDLDIRECT 134.0 04/11/2023   LDLCALC 117 (H) 10/14/2023   ALT 11 10/14/2023   AST 20 10/14/2023   NA 138 10/14/2023   K 4.0 10/14/2023   CL 103 10/14/2023   CREATININE 0.75 10/14/2023   BUN 10 10/14/2023   CO2 26 10/14/2023   TSH 4.16 10/14/2023   HGBA1C 5.8 10/14/2023   MICROALBUR 1.2 04/11/2023    MM 3D SCREENING MAMMOGRAM BILATERAL BREAST Result Date: 09/27/2023 CLINICAL DATA:  Screening. EXAM: DIGITAL SCREENING BILATERAL MAMMOGRAM WITH TOMOSYNTHESIS AND CAD TECHNIQUE: Bilateral screening digital craniocaudal and mediolateral oblique mammograms were obtained. Bilateral screening digital breast tomosynthesis was performed. The images were evaluated with computer-aided detection. COMPARISON:  Previous exam(s). ACR Breast Density Category c: The breasts are heterogeneously dense, which may obscure small masses. FINDINGS: There are no findings suspicious for malignancy. IMPRESSION: No mammographic evidence of malignancy. A result letter of this screening  mammogram will be mailed directly to the patient. RECOMMENDATION: Screening mammogram in one year. (Code:SM-B-01Y) BI-RADS CATEGORY  1: Negative. Electronically Signed   By: Frederico Hamman M.D.   On: 09/27/2023 09:41    Assessment & Plan:  .Weight loss, unintentional Assessment & Plan: Attributed by patient to stress of caregiving .   I have reviewed her diet and recommended that she increase her protein and fat intake while monitoring her carbohydrates.    Lab Results  Component Value Date   TSH 4.16 10/14/2023      Vitamin D deficiency -     VITAMIN D 25 Hydroxy (Vit-D Deficiency, Fractures)  Impaired fasting glucose -     Hemoglobin A1c  Hyperlipidemia, unspecified hyperlipidemia type -  TSH -     Lipid panel  Essential hypertension Assessment & Plan: Well controlled on current regimen.  Of amlodipine. , no changes today.   Orders: -     Comprehensive metabolic panel  Fatigue, unspecified type -     CBC with Differential/Platelet  Adjustment insomnia Assessment & Plan: She has tried the natural remedies for insomnia including herbal tea and melatonin, and adheres to the principles of good sleep hygiene.  Trial of lunesta was also not helpful.  I have reviewed the dangers of prescribing ambien to the elderly with patient.  She prefers to continue using [place]  .  Refills given . Advised not to combine with alprazolam   Osteopenia after menopause Assessment & Plan: Repeat DEXA 2023 notes  Unchanged  Tscores of -2.3, but her risk of fracture is 24%.  Continue use of  alendronate  Reviewed calcium and vitamin D needs    Caregiver with fatigue Assessment & Plan: counselling given regarding her management of personal needs,  fatigue and stress related to care of husband who is 11 yrs older than her and requiring increased care due to diagnosis of multiple myeloma/    Anxiety as acute reaction to exceptional stress Assessment & Plan: Secondary to husband's life  threatening illnesses.  She has requested a  refill of low dose of alprazolam to use prn.  The risks and benefits of benzodiazepine use were discussed with patient today including excessive sedation leading to respiratory depression,  impaired thinking/driving, and addiction.  Patient was advised to avoid concurrent use with alcohol, to use medication only as needed and not to share with others  .     Other orders -     amLODIPine Besylate; Take 1 tablet (2.5 mg total) by mouth daily.  Dispense: 90 tablet; Refill: 3 -     ALPRAZolam; Take 0.5 tablets (0.125 mg total) by mouth 2 (two) times daily as needed for anxiety.  Dispense: 30 tablet; Refill: 5 -     Zolpidem Tartrate; TAKE 1 TABLET BY MOUTH EVERY DAY AT BEDTIME AS NEEDED FOR INSOMNIA  Dispense: 30 tablet; Refill: 5     I spent 34 minutes on the day of this face to face encounter reviewing patient's  , recent  labs and imaging studies, counseling on management of underweight , caregiver fatigue ,  reviewing the assessment and plan with patient, and post visit ordering and reviewing of  diagnostics and therapeutics with patient  .   Follow-up: Return in about 6 months (around 04/12/2024) for physical.   Sherlene Shams, MD

## 2023-10-14 NOTE — Patient Instructions (Addendum)
  I recommending  that you add  a daily  premixed  protein drink called Premier Protein shake for breakfast or late night snack  to improve your protein intake  Nutritional analysis :  160 cal  30 g protein  1 g sugar 50% calcium needs   [employer] and [employer]   I have refilled the alprazolam and ambien for 6 months

## 2023-10-14 NOTE — Assessment & Plan Note (Signed)
 Attributed by patient to stress of caregiving .   I have reviewed her diet and recommended that she increase her protein and fat intake while monitoring her carbohydrates.    Lab Results  Component Value Date   TSH 4.16 10/14/2023

## 2023-10-15 ENCOUNTER — Encounter: Payer: Self-pay | Admitting: Internal Medicine

## 2023-10-15 NOTE — Assessment & Plan Note (Signed)
 Secondary to husband's life threatening illnesses.  She has requested a  refill of low dose of alprazolam to use prn.  The risks and benefits of benzodiazepine use were discussed with patient today including excessive sedation leading to respiratory depression,  impaired thinking/driving, and addiction.  Patient was advised to avoid concurrent use with alcohol, to use medication only as needed and not to share with others  .

## 2023-10-15 NOTE — Assessment & Plan Note (Signed)
Well controlled on current regimen.  Of amlodipine. , no changes today.

## 2023-10-15 NOTE — Assessment & Plan Note (Signed)
 counselling given regarding her management of personal needs,  fatigue and stress related to care of husband who is 11 yrs older than her and requiring increased care due to diagnosis of multiple myeloma/

## 2023-10-15 NOTE — Assessment & Plan Note (Signed)
 Repeat DEXA 2023 notes  Unchanged  Tscores of -2.3, but her risk of fracture is 24%.  Continue use of  alendronate  Reviewed calcium and vitamin D needs

## 2023-10-16 DIAGNOSIS — K080 Exfoliation of teeth due to systemic causes: Secondary | ICD-10-CM | POA: Diagnosis not present

## 2023-11-14 ENCOUNTER — Encounter: Payer: Self-pay | Admitting: Internal Medicine

## 2023-11-14 ENCOUNTER — Ambulatory Visit: Admitting: Internal Medicine

## 2023-11-14 VITALS — BP 132/76 | HR 82 | Ht 65.0 in | Wt 121.2 lb

## 2023-11-14 DIAGNOSIS — F4321 Adjustment disorder with depressed mood: Secondary | ICD-10-CM | POA: Diagnosis not present

## 2023-11-14 DIAGNOSIS — R634 Abnormal weight loss: Secondary | ICD-10-CM | POA: Diagnosis not present

## 2023-11-14 NOTE — Progress Notes (Unsigned)
 Subjective:  Patient ID: Kimberly Gill, female    DOB: 05-Apr-1951  Age: 73 y.o. MRN: [id number]  CC: There were no encounter diagnoses.   HPI Kimberly Gill presents for  Chief Complaint  Patient presents with  . Depression    Pt's husband recently passed away   Grief:  Kimberly Gill was seen one month ago,  husband was battling MM .  He has since passed away  Outpatient Medications Prior to Visit  Medication Sig Dispense Refill  . alendronate (FOSAMAX) 70 MG tablet Take 1 tablet (70 mg total) by mouth every 7 (seven) days. Take with a full glass of water on an empty stomach. 4 tablet 11  . ALPRAZolam (XANAX) 0.25 MG tablet Take 0.5 tablets (0.125 mg total) by mouth 2 (two) times daily as needed for anxiety. 30 tablet 5  . amLODipine (NORVASC) 2.5 MG tablet Take 1 tablet (2.5 mg total) by mouth daily. 90 tablet 3  . calcium citrate-vitamin D (CITRACAL+D) 315-200 MG-UNIT per tablet Take 1 tablet by mouth 2 (two) times daily.    . Multiple Vitamin (MULTIVITAMIN) tablet Take 1 tablet by mouth daily.    Marland Kitchen zolpidem (AMBIEN) 10 MG tablet TAKE 1 TABLET BY MOUTH EVERY DAY AT BEDTIME AS NEEDED FOR INSOMNIA 30 tablet 5   No facility-administered medications prior to visit.    Review of Systems;  Patient denies headache, fevers, malaise, unintentional weight loss, skin rash, eye pain, sinus congestion and sinus pain, sore throat, dysphagia,  hemoptysis , cough, dyspnea, wheezing, chest pain, palpitations, orthopnea, edema, abdominal pain, nausea, melena, diarrhea, constipation, flank pain, dysuria, hematuria, urinary  Frequency, nocturia, numbness, tingling, seizures,  Focal weakness, Loss of consciousness,  Tremor, insomnia, depression, anxiety, and suicidal ideation.      Objective:  BP 132/76   Pulse 82   Ht 5\' 5"  (1.651 m)   Wt 121 lb 3.2 oz (55 kg)   SpO2 98%   BMI 20.17 kg/m   BP Readings from Last 3 Encounters:  11/14/23 132/76  10/14/23 126/70  04/15/23 126/76    Wt  Readings from Last 3 Encounters:  11/14/23 121 lb 3.2 oz (55 kg)  10/14/23 125 lb 3.2 oz (56.8 kg)  04/23/23 124 lb (56.2 kg)    Physical Exam  Lab Results  Component Value Date   HGBA1C 5.8 10/14/2023   HGBA1C 5.7 10/08/2022   HGBA1C 6.0 04/05/2022    Lab Results  Component Value Date   CREATININE 0.75 10/14/2023   CREATININE 0.69 04/11/2023   CREATININE 0.83 10/08/2022    Lab Results  Component Value Date   WBC 4.6 10/14/2023   HGB 13.1 10/14/2023   HCT 40.1 10/14/2023   PLT 257.0 10/14/2023   GLUCOSE 100 (H) 10/14/2023   CHOL 238 (H) 10/14/2023   TRIG 51.0 10/14/2023   HDL 110.40 10/14/2023   LDLDIRECT 134.0 04/11/2023   LDLCALC 117 (H) 10/14/2023   ALT 11 10/14/2023   AST 20 10/14/2023   NA 138 10/14/2023   K 4.0 10/14/2023   CL 103 10/14/2023   CREATININE 0.75 10/14/2023   BUN 10 10/14/2023   CO2 26 10/14/2023   TSH 4.16 10/14/2023   HGBA1C 5.8 10/14/2023   MICROALBUR 1.2 04/11/2023    MM 3D SCREENING MAMMOGRAM BILATERAL BREAST Result Date: 09/27/2023 CLINICAL DATA:  Screening. EXAM: DIGITAL SCREENING BILATERAL MAMMOGRAM WITH TOMOSYNTHESIS AND CAD TECHNIQUE: Bilateral screening digital craniocaudal and mediolateral oblique mammograms were obtained. Bilateral screening digital breast tomosynthesis was performed. The images were  evaluated with computer-aided detection. COMPARISON:  Previous exam(s). ACR Breast Density Category c: The breasts are heterogeneously dense, which may obscure small masses. FINDINGS: There are no findings suspicious for malignancy. IMPRESSION: No mammographic evidence of malignancy. A result letter of this screening mammogram will be mailed directly to the patient. RECOMMENDATION: Screening mammogram in one year. (Code:SM-B-01Y) BI-RADS CATEGORY  1: Negative. Electronically Signed   By: Frederico Hamman M.D.   On: 09/27/2023 09:41    Assessment & Plan:  .There are no diagnoses linked to this encounter.   I spent 34 minutes on the day  of this face to face encounter reviewing patient's  most recent visit with cardiology,  nephrology,  and neurology,  prior relevant surgical and non surgical procedures, recent  labs and imaging studies, counseling on weight management,  reviewing the assessment and plan with patient, and post visit ordering and reviewing of  diagnostics and therapeutics with patient  .   Follow-up: No follow-ups on file.   Sherlene Shams, MD

## 2023-11-14 NOTE — Patient Instructions (Signed)
 Please call [facility] organization and ask to be seen for grief counselling.  This is a journey  Do not lose any more weight.  Get some Premier Protein shakes (caramel is best) and blend with ice cream as a meal substitute when you don't feeel hungry   I and [employer] members will be praying that the peace of Christ and the comfort of the [employer] descend on you like a dove and surround you always

## 2023-11-15 NOTE — Assessment & Plan Note (Signed)
 Patient is dealing with the  loss of spouse and has adequate coping skills and emotional support .  Counselling given ,  advised to contact Hospice  for ongoing grief counselling. i have asked patient to return in one month to examine for signs of unresolving grief.

## 2023-11-15 NOTE — Assessment & Plan Note (Signed)
 Attributed by patient to stress of caregiving followed by grief  .   I have reviewed her diet and recommended that she increase her protein and fat intake while monitoring her carbohydrates.

## 2024-02-14 ENCOUNTER — Ambulatory Visit: Admitting: Internal Medicine

## 2024-02-17 ENCOUNTER — Ambulatory Visit: Admitting: Internal Medicine

## 2024-02-26 DIAGNOSIS — K080 Exfoliation of teeth due to systemic causes: Secondary | ICD-10-CM | POA: Diagnosis not present

## 2024-04-14 ENCOUNTER — Encounter: Payer: Self-pay | Admitting: Internal Medicine

## 2024-04-14 ENCOUNTER — Ambulatory Visit (INDEPENDENT_AMBULATORY_CARE_PROVIDER_SITE_OTHER): Payer: Medicare Other | Admitting: Internal Medicine

## 2024-04-14 VITALS — BP 128/72 | HR 67 | Ht 65.0 in | Wt 124.8 lb

## 2024-04-14 DIAGNOSIS — I10 Essential (primary) hypertension: Secondary | ICD-10-CM | POA: Diagnosis not present

## 2024-04-14 DIAGNOSIS — F4321 Adjustment disorder with depressed mood: Secondary | ICD-10-CM

## 2024-04-14 DIAGNOSIS — Z634 Disappearance and death of family member: Secondary | ICD-10-CM | POA: Insufficient documentation

## 2024-04-14 DIAGNOSIS — Z0001 Encounter for general adult medical examination with abnormal findings: Secondary | ICD-10-CM | POA: Diagnosis not present

## 2024-04-14 DIAGNOSIS — E559 Vitamin D deficiency, unspecified: Secondary | ICD-10-CM

## 2024-04-14 MED ORDER — ALENDRONATE SODIUM 70 MG PO TABS: 70.0000 mg | ORAL_TABLET | ORAL | 11 refills | Status: AC

## 2024-04-14 MED ORDER — ZOLPIDEM TARTRATE 10 MG PO TABS
ORAL_TABLET | ORAL | 5 refills | Status: AC
Start: 2024-04-14 — End: ?

## 2024-04-14 NOTE — Assessment & Plan Note (Signed)
 Patient is still grieving the  loss of her spouse and has adequate coping skills and strong emotional support rom he rfamily and friendds..  Counselling given ,  advised to contact Hospice  for ongoing grief counselling. i have asked patient to return in one month to examine for signs of unresolving grief.

## 2024-04-14 NOTE — Progress Notes (Signed)
 Patient ID: Kimberly Gill, female    DOB: 1950/10/24  Age: 73 y.o. MRN: [id number]  The patient is here for annual preventive examination and management of other chronic and acute problems.   The risk factors are reflected in the social history.   The roster of all physicians providing medical care to patient - is listed in the Snapshot section of the chart.   Activities of daily living:  The patient is 100% independent in all ADLs: dressing, toileting, feeding as well as independent mobility   Home safety : The patient has smoke detectors in the home. They wear seatbelts.  There are no unsecured firearms at home. There is no violence in the home.    There is no risks for hepatitis, STDs or HIV. There is no   history of blood transfusion. They have no travel history to infectious disease endemic areas of the world.   The patient has seen their dentist in the last six month. They have seen their eye doctor in the last year. The patinet  denies slight hearing difficulty with regard to whispered voices and some television programs.  They have deferred audiologic testing in the last year.  They do not  have excessive sun exposure. Discussed the need for sun protection: hats, long sleeves and use of sunscreen if there is significant sun exposure.    Diet: the importance of a healthy diet is discussed. They do have a healthy diet.   The benefits of regular aerobic exercise were discussed. The patient  exercises  3 to 5 days per week  for  60 minutes.    Depression screen: there are no signs or vegative symptoms of depression- irritability, change in appetite, anhedonia, sadness/tearfullness.   The following portions of the patient's history were reviewed and updated as appropriate: allergies, current medications, past family history, past medical history,  past surgical history, past social history  and problem list.   Visual acuity was not assessed per patient preference since the patient has  regular follow up with an  ophthalmologist. Hearing and body mass index were assessed and reviewed.    During the course of the visit the patient was educated and counseled about appropriate screening and preventive services including : fall prevention , diabetes screening, nutrition counseling, colorectal cancer screening, and recommended immunizations.    Chief Complaint:   grief following loss of husband    Review of Symptoms  Patient denies headache, fevers, malaise, unintentional weight loss, skin rash, eye pain, sinus congestion and sinus pain, sore throat, dysphagia,  hemoptysis , cough, dyspnea, wheezing, chest pain, palpitations, orthopnea, edema, abdominal pain, nausea, melena, diarrhea, constipation, flank pain, dysuria, hematuria, urinary  Frequency, nocturia, numbness, tingling, seizures,  Focal weakness, Loss of consciousness,  Tremor, insomnia, depression, anxiety, and suicidal ideation.    Physical Exam:  BP 128/72   Pulse 67   Ht 5' 5 (1.651 m)   Wt 124 lb 12.8 oz (56.6 kg)   SpO2 99%   BMI 20.77 kg/m    Physical Exam Vitals reviewed.  Constitutional:      General: She is not in acute distress.    Appearance: Normal appearance. She is well-developed and normal weight. She is not ill-appearing, toxic-appearing or diaphoretic.  HENT:     Head: Normocephalic.     Right Ear: Tympanic membrane, ear canal and external ear normal. There is no impacted cerumen.     Left Ear: Tympanic membrane, ear canal and external ear normal. There is no  impacted cerumen.     Nose: Nose normal.     Mouth/Throat:     Mouth: Mucous membranes are moist.     Pharynx: Oropharynx is clear.  Eyes:     General: No scleral icterus.       Right eye: No discharge.        Left eye: No discharge.     Conjunctiva/sclera: Conjunctivae normal.     Pupils: Pupils are equal, round, and reactive to light.  Neck:     Thyroid : No thyromegaly.     Vascular: No carotid bruit or JVD.  Cardiovascular:      Rate and Rhythm: Normal rate and regular rhythm.     Heart sounds: Normal heart sounds.  Pulmonary:     Effort: Pulmonary effort is normal. No respiratory distress.     Breath sounds: Normal breath sounds.  Chest:  Breasts:    Breasts are symmetrical.     Right: Normal. No swelling, inverted nipple, mass, nipple discharge, skin change or tenderness.     Left: Normal. No swelling, inverted nipple, mass, nipple discharge, skin change or tenderness.  Abdominal:     General: Bowel sounds are normal.     Palpations: Abdomen is soft. There is no mass.     Tenderness: There is no abdominal tenderness. There is no guarding or rebound.  Musculoskeletal:        General: Normal range of motion.     Cervical back: Normal range of motion and neck supple.  Lymphadenopathy:     Cervical: No cervical adenopathy.     Upper Body:     Right upper body: No supraclavicular, axillary or pectoral adenopathy.     Left upper body: No supraclavicular, axillary or pectoral adenopathy.  Skin:    General: Skin is warm and dry.  Neurological:     General: No focal deficit present.     Mental Status: She is alert and oriented to person, place, and time. Mental status is at baseline.  Psychiatric:        Mood and Affect: Mood normal.        Behavior: Behavior normal.        Thought Content: Thought content normal.        Judgment: Judgment normal.     Assessment and Plan: Essential hypertension -     Comprehensive metabolic panel with GFR; Future -     Microalbumin / creatinine urine ratio; Future -     Lipid Panel w/reflex Direct LDL; Future  Widowed  Vitamin D  insufficiency -     VITAMIN D  25 Hydroxy (Vit-D Deficiency, Fractures); Future  Encounter for general adult medical examination with abnormal findings Assessment & Plan: age appropriate education and counseling updated, referrals for preventative services and immunizations addressed, dietary and smoking counseling addressed, most recent  labs reviewed.  I have personally reviewed and have noted:   1) the patient's medical and social history 2) The pt's use of alcohol, tobacco, and illicit drugs 3) The patient's current medications and supplements 4) Functional ability including ADL's, fall risk, home safety risk, hearing and visual impairment 5) Diet and physical activities 6) Evidence for depression or mood disorder 7) The patient's height, weight, and BMI have been recorded in the chart   I have made referrals, and provided counseling and education based on review of the above    Grief Assessment & Plan: Patient is still grieving the  loss of her spouse and has adequate coping skills and strong  emotional support rom he rfamily and friendds..  Counselling given ,  advised to contact Hospice  for ongoing grief counselling. i have asked patient to return in one month to examine for signs of unresolving grief.     Other orders -     Alendronate  Sodium; Take 1 tablet (70 mg total) by mouth every 7 (seven) days. Take with a full glass of water on an empty stomach.  Dispense: 4 tablet; Refill: 11 -     Zolpidem  Tartrate; TAKE 1 TABLET BY MOUTH EVERY DAY AT BEDTIME AS NEEDED FOR INSOMNIA  Dispense: 30 tablet; Refill: 5    Return in about 6 months (around 10/15/2024) for insmonia, grief , osteoporosis .  Verneita LITTIE Kettering, MD

## 2024-04-14 NOTE — Patient Instructions (Signed)
 Take 2000 ius of d3 daily  Try  adding one serving of benefiber daily to your beverage    The morning devotional I showed  you is from a ministry called [employer],. The post a morning prayer on YouTube daily

## 2024-04-14 NOTE — Assessment & Plan Note (Signed)

## 2024-07-08 DIAGNOSIS — D2272 Melanocytic nevi of left lower limb, including hip: Secondary | ICD-10-CM | POA: Diagnosis not present

## 2024-07-08 DIAGNOSIS — D2261 Melanocytic nevi of right upper limb, including shoulder: Secondary | ICD-10-CM | POA: Diagnosis not present

## 2024-07-08 DIAGNOSIS — D2262 Melanocytic nevi of left upper limb, including shoulder: Secondary | ICD-10-CM | POA: Diagnosis not present

## 2024-07-08 DIAGNOSIS — D225 Melanocytic nevi of trunk: Secondary | ICD-10-CM | POA: Diagnosis not present

## 2024-08-06 ENCOUNTER — Ambulatory Visit: Admitting: Nurse Practitioner

## 2024-10-15 ENCOUNTER — Ambulatory Visit: Admitting: Internal Medicine
